# Patient Record
Sex: Female | Born: 1970 | Race: White | Hispanic: No | Marital: Single
Health system: Midwestern US, Academic
[De-identification: ages and names within clinical notes are randomized; demographics above are authoritative.]

---

## 2020-11-02 ENCOUNTER — Encounter: Admit: 2020-11-02 | Discharge: 2020-11-02

## 2020-11-02 NOTE — Telephone Encounter
No personal voice mail.  No message left.

## 2020-11-10 ENCOUNTER — Encounter

## 2020-11-10 DIAGNOSIS — H903 Sensorineural hearing loss, bilateral: Secondary | ICD-10-CM

## 2020-11-10 DIAGNOSIS — H9190 Unspecified hearing loss, unspecified ear: Secondary | ICD-10-CM

## 2020-11-10 DIAGNOSIS — H900 Conductive hearing loss, bilateral: Secondary | ICD-10-CM

## 2020-11-10 DIAGNOSIS — F419 Anxiety disorder, unspecified: Secondary | ICD-10-CM

## 2020-11-10 DIAGNOSIS — E119 Type 2 diabetes mellitus without complications: Secondary | ICD-10-CM

## 2020-11-10 DIAGNOSIS — J302 Other seasonal allergic rhinitis: Secondary | ICD-10-CM

## 2020-11-10 DIAGNOSIS — Q899 Congenital malformation, unspecified: Secondary | ICD-10-CM

## 2020-11-10 DIAGNOSIS — H906 Mixed conductive and sensorineural hearing loss, bilateral: Secondary | ICD-10-CM

## 2020-11-17 ENCOUNTER — Encounter

## 2020-11-17 DIAGNOSIS — H906 Mixed conductive and sensorineural hearing loss, bilateral: Secondary | ICD-10-CM

## 2020-11-30 ENCOUNTER — Encounter: Admit: 2020-11-30 | Discharge: 2020-11-30 | Payer: MEDICARE

## 2020-11-30 NOTE — Telephone Encounter
LVM for Raiford Simmonds, Medical Coordinator, at [employer] regarding need to set up an appt with Dr. Juel Burrow. Name/call back number left.

## 2020-12-01 ENCOUNTER — Encounter: Admit: 2020-12-01 | Discharge: 2020-12-01 | Payer: MEDICARE

## 2020-12-01 NOTE — Telephone Encounter
Raiford Simmonds, medical coordinator, calling to schedule appointment for patient. Cathy agreeable to date/time.

## 2020-12-08 ENCOUNTER — Encounter: Admit: 2020-12-08 | Discharge: 2020-12-08 | Payer: MEDICARE

## 2020-12-08 ENCOUNTER — Ambulatory Visit: Admit: 2020-12-08 | Discharge: 2020-12-08 | Payer: MEDICARE

## 2020-12-08 DIAGNOSIS — H9190 Unspecified hearing loss, unspecified ear: Secondary | ICD-10-CM

## 2020-12-08 DIAGNOSIS — E119 Type 2 diabetes mellitus without complications: Secondary | ICD-10-CM

## 2020-12-08 DIAGNOSIS — H6123 Impacted cerumen, bilateral: Secondary | ICD-10-CM

## 2020-12-08 DIAGNOSIS — J302 Other seasonal allergic rhinitis: Secondary | ICD-10-CM

## 2020-12-08 DIAGNOSIS — F419 Anxiety disorder, unspecified: Secondary | ICD-10-CM

## 2020-12-08 DIAGNOSIS — H7193 Unspecified cholesteatoma, bilateral: Secondary | ICD-10-CM

## 2020-12-08 DIAGNOSIS — Z20822 Encounter for screening laboratory testing for COVID-19 virus in asymptomatic patient: Secondary | ICD-10-CM

## 2020-12-08 DIAGNOSIS — H7123 Cholesteatoma of mastoid, bilateral: Secondary | ICD-10-CM

## 2020-12-08 DIAGNOSIS — Q899 Congenital malformation, unspecified: Secondary | ICD-10-CM

## 2020-12-08 NOTE — Patient Instructions
You may feel dizzy for a few days after surgery. The cut (incision) the doctor made behind your ear may be sore, and you may have ear pain for about a week. Some bloody fluid may drain from your ear canal and the incision. Your ear will probably feel blocked or stuffy. You may not be able to hear as well as before. This usually gets better as the eardrum heals and after the doctor takes the cotton or gauze packing out of the ear canal. The doctor will take out the packing 1 to 2 weeks after surgery. It may take time before your hearing gets better (up to 12 weeks after your surgery). While you are healing, it is important to avoid getting water in your ear. You will also need to avoid heavy lifting, strenuous exercise, and other activities that may put pressure on your eardrum. This includes flying in an airplane, swimming, scuba diving, or playing contact sports.    General Instructions for Surgery Patients    Your Surgery is scheduled for 01/14/2021 with Dr. Paulene Floor      Preparing for Surgery      ? You will get a call from a member of our preregistration team to complete your registration.   If you have any insurance changes prior to your surgery please call [phone] to provide   the updated insurance information.  ? Hold Vitamin E, Vitamin A, Fish oil, multivitamin, and herbal supplements for 14     days prior to surgery.  Hold Ibuprofen (Advil, Motrin), Naproxen (Aleve), and any     other Non-Steroidal Anti-inflammatory medications for 7 days prior to surgery.  If     you take aspirin or any blood thinners, please contact the prescribing physician     for recommendation regarding holding these medications prior to surgery.      Do not stop taking any heart medications or aspirin without seeking the     Approval of your Cardiologist.    ? Refrain from smoking two weeks before and two weeks after surgery.  Nicotine     and tobacco smoke delays healing and can result in scarring. This is the perfect     time to give up the habit.    ? Do not eat or drink anything, including water, after midnight the night before your     surgery.    ? Arrange for someone to take you home from the hospital.      ? Surgery is scheduled at [facility]. The address is [address], [place], [place] [address]. [facility] is on the main hospital campus at the   [employer] [facility] [address].  ? Park in the P5 parking garage off of [address].   ? Enter the [facility] through the 1st floor main entrance.   ? Check in with admitting on 1st floor.   (If you are pre-registered you will go directly to either 2nd floor if going to IR, or 3rd floor to surgery).   ? Check in with surgical reception center (Waiting Room Attendant) after being registered.  ? You will be escorted by staff up to OR.  ? Family will need to expect to wait on 1st level in common areas except during certain points in the visit when they can accompany you.   ? You will be contacted between 2:30 and 4:00 on the last business day before your procedure. If you do not  hear from the Preoperative Assessment Clinic to confirm your arrival time call [phone] before 4:30 p.m.     The Day of Surgery      ? Do not eat or drink anything, including water, after midnight the morning of     surgery.      Essential medication may be taken with a sip of water.    ? Wear loose-fitting clothes that fasten in front or back.  Avoid clothing that pulls     over your head.    ? Leave all valuables at home; do not wear jewelry.    ? Do not wear any facial or eye make-up.  Avoid nail polish.    ? You may wear glasses but do not wear contact lenses.   ? If you wear dentures, keep them in.    ? Bring ID and insurance card with you.     Restrictions --  No strenuous activity or lifting over 10 pounds for two (2) weeks following your Surgery.     If you are concerned about anything you consider significant, call us at [phone]?during business hours, ask for your nurse, Sherron Monday, or the on-call nurse. After hours ask to speak to the ENT doctor on call.      Please call our office if the following should develop after surgery:  a) Cold or sinus infection  b) Foul smelling or excessive drainage from the incision site  c) Temperature greater than 101  d) Excessive swelling or bleeding  e) Persistent nausea or vomiting

## 2020-12-08 NOTE — Progress Notes
Date of Service: 12/08/2020    Subjective:             Kimberly Sullivan is a 50 y.o. female.    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  She lives in a group home, she denies any history of bad ear infections or head trauma.  She did use hearing aids, but they were getting lost frequently.  She otherwise seems relatively healthy.  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     Review of Systems   Constitutional: Negative.    HENT: Positive for hearing loss.    Eyes: Negative.    Respiratory: Negative.    Cardiovascular: Negative.    Gastrointestinal: Negative.    Endocrine: Negative.    Genitourinary: Negative.    Musculoskeletal: Negative.    Skin: Negative.    Allergic/Immunologic: Negative.    Neurological: Negative.    Hematological: Negative.    Psychiatric/Behavioral: Negative.          Objective:         ? fenofibrate nanocrystallized (TRICOR) 145 mg tablet    ? loratadine (CLARITIN) 10 mg tablet    ? metFORMIN (GLUCOPHAGE) 1,000 mg tablet    ? montelukast (SINGULAIR) 10 mg tablet    ? PARoxetine HCL (PAXIL) 30 mg tablet      Vitals:    12/08/20 0835   BP: 116/75   Pulse: 91   Weight: 68 kg (150 lb)   Height: 167.6 cm (5' 6)   PainSc: Zero     Body mass index is 24.21 kg/m?Marland Kitchen     Physical Exam  Under microscopy I removed impacted cerumen from both sides, the right eardrum is retracted in the attic as well as along the posterior mesotympanic space, there is a waxy appearing shiny lesion at the area of the incudostapedial joint behind the eardrum.  On the left side, she has more intense retraction down onto her tympanic facial, the oval window is visible without much in the way of stapes superstructure.  Her audiogram done here previously reveals a right mild to severe mixed hearing loss and a left moderate to severe mixed hearing loss with 96% and 92% discrimination in the ears, respectively.  CT scan was reviewed revealing nodular opacification of the right mastoid as well as opacification of the left mastoid, its moderately pneumatized.       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  She lives in a group home, she denies any history of bad ear infections or head trauma.  She did use hearing aids, but they were getting lost frequently.  She otherwise seems relatively healthy.  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    Under microscopy I removed impacted cerumen from both sides, the right eardrum is retracted in the attic as well as along the posterior mesotympanic space, there is a waxy appearing shiny lesion at the area of the incudostapedial joint behind the eardrum.  On the left side, she has more intense retraction down onto her tympanic facial, the oval window is visible without much in the way of stapes superstructure.  Her audiogram done here previously reveals a right mild to severe mixed hearing loss and a left moderate to severe mixed hearing loss with 96% and 92% discrimination in the ears, respectively.  CT scan was reviewed revealing nodular opacification of the right  mastoid as well as opacification of the left mastoid, its moderately pneumatized.    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  I discussed a staged tympanoplasty with mastoidectomy initially on the left side with risks of hearing loss, facial weakness, dizziness, infection, bleeding, and cerebrospinal fluid leak.  We will get her set up at [facility].

## 2020-12-10 ENCOUNTER — Ambulatory Visit: Admit: 2020-12-10 | Discharge: 2020-12-10 | Payer: MEDICARE

## 2020-12-10 DIAGNOSIS — H7193 Unspecified cholesteatoma, bilateral: Secondary | ICD-10-CM

## 2021-01-05 ENCOUNTER — Encounter: Admit: 2021-01-05 | Discharge: 2021-01-05 | Payer: MEDICARE

## 2021-01-05 DIAGNOSIS — J302 Other seasonal allergic rhinitis: Secondary | ICD-10-CM

## 2021-01-05 DIAGNOSIS — F419 Anxiety disorder, unspecified: Secondary | ICD-10-CM

## 2021-01-05 DIAGNOSIS — E119 Type 2 diabetes mellitus without complications: Secondary | ICD-10-CM

## 2021-01-05 DIAGNOSIS — Q899 Congenital malformation, unspecified: Secondary | ICD-10-CM

## 2021-01-05 DIAGNOSIS — H9190 Unspecified hearing loss, unspecified ear: Secondary | ICD-10-CM

## 2021-01-11 ENCOUNTER — Ambulatory Visit: Admit: 2021-01-11 | Discharge: 2021-01-11 | Payer: MEDICARE

## 2021-01-11 ENCOUNTER — Encounter: Admit: 2021-01-11 | Discharge: 2021-01-11 | Payer: MEDICARE

## 2021-01-11 DIAGNOSIS — Z20822 Encounter for screening laboratory testing for COVID-19 virus in asymptomatic patient: Secondary | ICD-10-CM

## 2021-01-11 LAB — COVID-19 (SARS-COV-2) PCR

## 2021-01-11 NOTE — Progress Notes
Patient arrived to COVID clinic for COVID-19 testing 01/11/21 9;35am. Patient identity confirmed. Nasopharyngeal procedure explained to the patient.   Nasopharyngeal swab completed left  Patient education provided given and instructed patient self isolate until contacted w/ results and further instructions. CDC handout on COVID-19 given to patient.   [email or web address]      Swab collected by [employer] .    Date reason for testing: Pre-Procedure/Surgery

## 2021-01-12 ENCOUNTER — Encounter: Admit: 2021-01-12 | Discharge: 2021-01-12 | Payer: MEDICARE

## 2021-01-12 NOTE — Telephone Encounter
Spoke to patient's caregiver. Caregiver unable to verify patient's date of birth. Caregiver stated she had already seen the patient's COVID-19 test result in MyChart.

## 2021-01-13 ENCOUNTER — Encounter: Admit: 2021-01-13 | Discharge: 2021-01-13 | Payer: MEDICARE

## 2021-01-14 ENCOUNTER — Encounter: Admit: 2021-01-14 | Discharge: 2021-01-14 | Payer: MEDICARE

## 2021-01-14 ENCOUNTER — Ambulatory Visit: Admit: 2021-01-14 | Discharge: 2021-01-14 | Payer: MEDICARE

## 2021-01-14 DIAGNOSIS — Q899 Congenital malformation, unspecified: Secondary | ICD-10-CM

## 2021-01-14 DIAGNOSIS — F419 Anxiety disorder, unspecified: Secondary | ICD-10-CM

## 2021-01-14 DIAGNOSIS — J302 Other seasonal allergic rhinitis: Secondary | ICD-10-CM

## 2021-01-14 DIAGNOSIS — E119 Type 2 diabetes mellitus without complications: Secondary | ICD-10-CM

## 2021-01-14 DIAGNOSIS — H9190 Unspecified hearing loss, unspecified ear: Secondary | ICD-10-CM

## 2021-01-14 MED ORDER — DEXAMETHASONE SODIUM PHOSPHATE 4 MG/ML IJ SOLN
INTRAVENOUS | 0 refills | Status: DC
Start: 2021-01-14 — End: 2021-01-14
  Administered 2021-01-14: 20:00:00 10 mg via INTRAVENOUS

## 2021-01-14 MED ORDER — CEFAZOLIN 1 GRAM IJ SOLR
INTRAVENOUS | 0 refills | Status: DC
Start: 2021-01-14 — End: 2021-01-14
  Administered 2021-01-14: 20:00:00 2 g via INTRAVENOUS

## 2021-01-14 MED ORDER — ELECTROLYTE-A IV SOLP
INTRAVENOUS | 0 refills | Status: DC
Start: 2021-01-14 — End: 2021-01-14
  Administered 2021-01-14: 20:00:00 via INTRAVENOUS

## 2021-01-14 MED ORDER — LIDOCAINE (PF) 200 MG/10 ML (2 %) IJ SYRG
INTRAVENOUS | 0 refills | Status: DC
Start: 2021-01-14 — End: 2021-01-14
  Administered 2021-01-14: 20:00:00 60 mg via INTRAVENOUS

## 2021-01-14 MED ORDER — FENTANYL CITRATE (PF) 50 MCG/ML IJ SOLN
INTRAVENOUS | 0 refills | Status: DC
Start: 2021-01-14 — End: 2021-01-14
  Administered 2021-01-14: 20:00:00 50 ug via INTRAVENOUS

## 2021-01-14 MED ORDER — SUCCINYLCHOLINE CHLORIDE 20 MG/ML IJ SOLN
INTRAVENOUS | 0 refills | Status: DC
Start: 2021-01-14 — End: 2021-01-14
  Administered 2021-01-14: 20:00:00 120 mg via INTRAVENOUS

## 2021-01-14 MED ORDER — ARTIFICIAL TEARS SINGLE DOSE DROPS GROUP
OPHTHALMIC | 0 refills | Status: DC
Start: 2021-01-14 — End: 2021-01-14
  Administered 2021-01-14: 20:00:00 2 [drp] via OPHTHALMIC

## 2021-01-14 MED ORDER — ONDANSETRON HCL (PF) 4 MG/2 ML IJ SOLN
INTRAVENOUS | 0 refills | Status: DC
Start: 2021-01-14 — End: 2021-01-14
  Administered 2021-01-14: 22:00:00 4 mg via INTRAVENOUS

## 2021-01-14 MED ORDER — PROPOFOL INJ 10 MG/ML IV VIAL
INTRAVENOUS | 0 refills | Status: DC
Start: 2021-01-14 — End: 2021-01-14
  Administered 2021-01-14: 20:00:00 150 mg via INTRAVENOUS

## 2021-01-14 MED ORDER — MIDAZOLAM 1 MG/ML IJ SOLN
INTRAVENOUS | 0 refills | Status: DC
Start: 2021-01-14 — End: 2021-01-14
  Administered 2021-01-14: 20:00:00 2 mg via INTRAVENOUS

## 2021-01-14 MED ORDER — DEXMEDETOMIDINE IN 0.9 % NACL 20 MCG/5 ML (4 MCG/ML) IV SYRG
INTRAVENOUS | 0 refills | Status: DC
Start: 2021-01-14 — End: 2021-01-14
  Administered 2021-01-14 (×2): 4 ug via INTRAVENOUS
  Administered 2021-01-14: 22:00:00 12 ug via INTRAVENOUS

## 2021-01-14 MED ORDER — PROPOFOL 10 MG/ML IV EMUL 100 ML (INFUSION)(AM)(OR)
INTRAVENOUS | 0 refills | Status: DC
Start: 2021-01-14 — End: 2021-01-14
  Administered 2021-01-14: 20:00:00 150 ug/kg/min via INTRAVENOUS

## 2021-01-14 MED ORDER — REMIFENTANYL 1000MCG IN NS 20ML (OR)
INTRAVENOUS | 0 refills | Status: DC
Start: 2021-01-14 — End: 2021-01-14
  Administered 2021-01-14 (×2): .05 ug/kg/min via INTRAVENOUS

## 2021-01-14 MED ADMIN — FAMOTIDINE (PF) 20 MG/2 ML IV SOLN [166077]: 20 mg | INTRAVENOUS | @ 20:00:00 | Stop: 2021-01-14 | NDC 70860075141

## 2021-01-14 MED ADMIN — CEFAZOLIN 1 GRAM IJ SOLR [1445]: 3000 mg | INTRAVENOUS | @ 21:00:00 | Stop: 2021-01-14 | NDC 00409080501

## 2021-01-14 MED ADMIN — OFLOXACIN 0.3 % OP DROP [19746]: 2 [drp] | OTIC | @ 21:00:00 | Stop: 2021-01-14 | NDC 64980051505

## 2021-01-14 MED ADMIN — LIDOCAINE-EPINEPHRINE 1 %-1:100,000 IJ SOLN [15955]: 8 mL | INTRAMUSCULAR | @ 21:00:00 | Stop: 2021-01-14 | NDC 00409317817

## 2021-01-14 MED ADMIN — LACTATED RINGERS IV SOLP [4318]: 1000 mL | INTRAVENOUS | @ 19:00:00 | Stop: 2021-01-15 | NDC 00338011704

## 2021-01-14 MED ADMIN — ACETAMINOPHEN 500 MG PO TAB [102]: 1000 mg | ORAL | @ 23:00:00 | Stop: 2021-01-14 | NDC 00904673061

## 2021-01-14 MED ADMIN — CEFAZOLIN 1 GRAM IJ SOLR [1445]: 1000 mg | INTRAVENOUS | @ 21:00:00 | Stop: 2021-01-14 | NDC 00409080501

## 2021-01-14 MED ADMIN — EPINEPHRINE 1 MG/ML IJ SOLN [2850]: 2 mg | @ 21:00:00 | Stop: 2021-01-14 | NDC 42023016801

## 2021-01-14 MED ADMIN — OXYCODONE 5 MG PO TAB [10814]: 5 mg | ORAL | @ 23:00:00 | Stop: 2021-01-14 | NDC 00406055223

## 2021-01-14 MED FILL — OFLOXACIN 0.3 % OT DROP: 0.3 % | 20 days supply | Qty: 10 | Fill #1 | Status: CP

## 2021-01-14 MED FILL — TRAMADOL 50 MG PO TAB: 50 mg | ORAL | 4 days supply | Qty: 12 | Fill #1 | Status: CP

## 2021-01-16 ENCOUNTER — Encounter: Admit: 2021-01-16 | Discharge: 2021-01-16 | Payer: MEDICARE

## 2021-01-16 DIAGNOSIS — Q899 Congenital malformation, unspecified: Secondary | ICD-10-CM

## 2021-01-16 DIAGNOSIS — J302 Other seasonal allergic rhinitis: Secondary | ICD-10-CM

## 2021-01-16 DIAGNOSIS — E119 Type 2 diabetes mellitus without complications: Secondary | ICD-10-CM

## 2021-01-16 DIAGNOSIS — H9190 Unspecified hearing loss, unspecified ear: Secondary | ICD-10-CM

## 2021-01-16 DIAGNOSIS — F419 Anxiety disorder, unspecified: Secondary | ICD-10-CM

## 2021-02-02 ENCOUNTER — Encounter: Admit: 2021-02-02 | Discharge: 2021-02-02 | Payer: MEDICARE

## 2021-02-02 ENCOUNTER — Ambulatory Visit: Admit: 2021-02-02 | Discharge: 2021-02-02 | Payer: MEDICARE

## 2021-02-02 DIAGNOSIS — J302 Other seasonal allergic rhinitis: Secondary | ICD-10-CM

## 2021-02-02 DIAGNOSIS — Q899 Congenital malformation, unspecified: Secondary | ICD-10-CM

## 2021-02-02 DIAGNOSIS — H9190 Unspecified hearing loss, unspecified ear: Secondary | ICD-10-CM

## 2021-02-02 DIAGNOSIS — H7123 Cholesteatoma of mastoid, bilateral: Secondary | ICD-10-CM

## 2021-02-02 DIAGNOSIS — E119 Type 2 diabetes mellitus without complications: Secondary | ICD-10-CM

## 2021-02-02 DIAGNOSIS — F419 Anxiety disorder, unspecified: Secondary | ICD-10-CM

## 2021-02-02 NOTE — Progress Notes
Date of Service: 02/02/2021    Subjective:             Kimberly Sullivan is a 50 y.o. female.    History of Present Illness  Kimberly Sullivan returns after left staged tympanoplasty with mastoidectomy.  She is doing well.     Review of Systems   Constitutional: Negative.    HENT: Negative.    Eyes: Negative.    Respiratory: Negative.    Cardiovascular: Negative.    Gastrointestinal: Negative.    Endocrine: Negative.    Genitourinary: Negative.    Musculoskeletal: Negative.    Skin: Negative.    Allergic/Immunologic: Negative.    Neurological: Negative.    Hematological: Negative.    Psychiatric/Behavioral: Negative.          Objective:         ? calcium carbonate (TUMS PO) Take  by mouth.   ? fenofibrate nanocrystallized (TRICOR) 145 mg tablet    ? fluticasone propionate (FLONASE) 50 mcg/actuation nasal spray, suspension Apply 2 sprays to each nostril as directed daily as needed. Shake bottle gently before using.   ? loratadine (CLARITIN) 10 mg tablet    ? metFORMIN (GLUCOPHAGE) 1,000 mg tablet    ? montelukast (SINGULAIR) 10 mg tablet    ? multivitamin (DAILY VITAMINS PO) Take  by mouth.   ? ofloxacin (FLOXIN) 0.3 % otic solution Beginning 2 weeks after surgery, start up 5 drops twice a day to the left ear.   ? PARoxetine HCL (PAXIL) 30 mg tablet    ? traMADoL (ULTRAM) 50 mg tablet Take one tablet by mouth every 8 hours as needed for Pain.     Vitals:    02/02/21 0907   BP: 118/79   Pulse: 90   PainSc: Zero   Weight: 65.3 kg (144 lb)   Height: 167.6 cm (5' 6)     Body mass index is 23.24 kg/m?Marland Kitchen     Physical Exam    Exam today reveals a well-healing incision behind the ear.  I did remove the deep packing from the left ear canal, there is some granulation tissue present along the vascular strip but otherwise eardrum looks intact.  It is vascularizing well.  I did powder it.     Assessment and Plan:  Kimberly Sullivan returns after left staged tympanoplasty with mastoidectomy.  She is doing well.    Exam today reveals a well-healing incision behind the ear.  I did remove the deep packing from the left ear canal, there is some granulation tissue present along the vascular strip but otherwise eardrum looks intact.  It is vascularizing well.  I did powder it.    Will keep the ear dry still and continue the eardrops, I will have her return in about 3 to 4 weeks.

## 2021-02-14 ENCOUNTER — Encounter: Admit: 2021-02-14 | Discharge: 2021-02-14 | Payer: MEDICARE

## 2021-02-23 ENCOUNTER — Encounter: Admit: 2021-02-23 | Discharge: 2021-02-23 | Payer: MEDICARE

## 2021-02-23 ENCOUNTER — Ambulatory Visit: Admit: 2021-02-23 | Discharge: 2021-02-23 | Payer: MEDICARE

## 2021-02-23 DIAGNOSIS — J302 Other seasonal allergic rhinitis: Secondary | ICD-10-CM

## 2021-02-23 DIAGNOSIS — F419 Anxiety disorder, unspecified: Secondary | ICD-10-CM

## 2021-02-23 DIAGNOSIS — E119 Type 2 diabetes mellitus without complications: Secondary | ICD-10-CM

## 2021-02-23 DIAGNOSIS — Q899 Congenital malformation, unspecified: Secondary | ICD-10-CM

## 2021-02-23 DIAGNOSIS — H9190 Unspecified hearing loss, unspecified ear: Secondary | ICD-10-CM

## 2021-02-23 DIAGNOSIS — H7123 Cholesteatoma of mastoid, bilateral: Secondary | ICD-10-CM

## 2021-02-23 NOTE — Progress Notes
Date of Service: 02/23/2021    Subjective:             Kimberly Sullivan is a 50 y.o. female.    History of Present Illness  Kimberly Sullivan is about 6 weeks out from a left tympanoplasty with mastoidectomy, staged for cholesteatoma.  She is doing well.     Review of Systems   Constitutional: Negative.    HENT: Negative.    Eyes: Negative.    Respiratory: Negative.    Cardiovascular: Negative.    Gastrointestinal: Negative.    Endocrine: Negative.    Genitourinary: Negative.    Musculoskeletal: Negative.    Skin: Negative.    Allergic/Immunologic: Negative.    Neurological: Negative.    Hematological: Negative.    Psychiatric/Behavioral: Negative.          Objective:         ? calcium carbonate (TUMS PO) Take  by mouth.   ? fenofibrate nanocrystallized (TRICOR) 145 mg tablet    ? fluticasone propionate (FLONASE) 50 mcg/actuation nasal spray, suspension Apply 2 sprays to each nostril as directed daily as needed. Shake bottle gently before using.   ? loratadine (CLARITIN) 10 mg tablet    ? metFORMIN (GLUCOPHAGE) 1,000 mg tablet    ? montelukast (SINGULAIR) 10 mg tablet    ? multivitamin (DAILY VITAMINS PO) Take  by mouth.   ? ofloxacin (FLOXIN) 0.3 % otic solution Beginning 2 weeks after surgery, start up 5 drops twice a day to the left ear.   ? PARoxetine HCL (PAXIL) 30 mg tablet    ? traMADoL (ULTRAM) 50 mg tablet Take one tablet by mouth every 8 hours as needed for Pain.     Vitals:    02/23/21 0909   BP: 124/76   Pulse: 98   PainSc: Zero   Weight: 65.3 kg (144 lb)   Height: 167.6 cm (5' 6)     Body mass index is 23.24 kg/m?Marland Kitchen     Physical Exam    The incision is healing well, aside from a small ball of granulation superiorly along the vascular strip incision laterally that I removed, the eardrum looks like it sealed and dry.  I did powder it.       Assessment and Plan:  Kimberly Sullivan is about 6 weeks out from a left tympanoplasty with mastoidectomy, staged for cholesteatoma.  She is doing well.    The incision is healing well, aside from a small ball of granulation superiorly along the vascular strip incision laterally that I removed, the eardrum looks like it sealed and dry.  I did powder it.    She can stop the drops, in a week it can get wet.  I will have her return in about 6 to 8 weeks with repeat hearing test and planning for the second look.

## 2021-04-20 ENCOUNTER — Ambulatory Visit: Admit: 2021-04-20 | Discharge: 2021-04-20 | Payer: MEDICARE

## 2021-04-20 ENCOUNTER — Encounter: Admit: 2021-04-20 | Discharge: 2021-04-20 | Payer: MEDICARE

## 2021-04-20 DIAGNOSIS — H7192 Unspecified cholesteatoma, left ear: Secondary | ICD-10-CM

## 2021-04-20 DIAGNOSIS — J302 Other seasonal allergic rhinitis: Secondary | ICD-10-CM

## 2021-04-20 DIAGNOSIS — H9190 Unspecified hearing loss, unspecified ear: Secondary | ICD-10-CM

## 2021-04-20 DIAGNOSIS — F419 Anxiety disorder, unspecified: Secondary | ICD-10-CM

## 2021-04-20 DIAGNOSIS — H93293 Other abnormal auditory perceptions, bilateral: Secondary | ICD-10-CM

## 2021-04-20 DIAGNOSIS — Q899 Congenital malformation, unspecified: Secondary | ICD-10-CM

## 2021-04-20 DIAGNOSIS — E119 Type 2 diabetes mellitus without complications: Secondary | ICD-10-CM

## 2021-04-20 DIAGNOSIS — H6122 Impacted cerumen, left ear: Secondary | ICD-10-CM

## 2021-04-20 DIAGNOSIS — H7123 Cholesteatoma of mastoid, bilateral: Secondary | ICD-10-CM

## 2021-04-20 NOTE — Patient Instructions
You may feel dizzy for a few days after surgery. The cut (incision) the doctor made behind your ear may be sore, and you may have ear pain for about a week. Some bloody fluid may drain from your ear canal and the incision. Your ear will probably feel blocked or stuffy. You may not be able to hear as well as before. This usually gets better as the eardrum heals and after the doctor takes the cotton or gauze packing out of the ear canal. The doctor will take out the packing 1 to 2 weeks after surgery. It may take time before your hearing gets better (up to 12 weeks after your surgery). While you are healing, it is important to avoid getting water in your ear. You will also need to avoid heavy lifting, strenuous exercise, and other activities that may put pressure on your eardrum. This includes swimming, scuba diving, or playing contact sports.    General Instructions for Surgery Patients    Your Surgery is scheduled for 06/24/2021 with Dr. Paulene Floor      Preparing for Surgery      ? You will get a call from a member of our preregistration team to complete your registration.   If you have any insurance changes prior to your surgery please call [phone] to provide   the updated insurance information.  ? Hold Vitamin E, Vitamin A, Fish oil, multivitamin, and herbal supplements for 14     days prior to surgery.  Hold Ibuprofen (Advil, Motrin), Naproxen (Aleve), and any     other Non-Steroidal Anti-inflammatory medications for 7 days prior to surgery.  If     you take aspirin or any blood thinners, please contact the prescribing physician     for recommendation regarding holding these medications prior to surgery.      Do not stop taking any heart medications or aspirin without seeking the     Approval of your Cardiologist.    ? Refrain from smoking two weeks before and two weeks after surgery.  Nicotine     and tobacco smoke delays healing and can result in scarring. This is the perfect     time to give up the habit. ? Do not eat or drink anything, including water, after midnight the night before your     surgery.    ? Arrange for someone to take you home from the hospital.      ? Surgery is scheduled at [facility]. The address is [address], [place], [place] [address]. [facility] is on the main hospital campus at the   [employer] [address] [employer].  ? Park in the P5 parking garage off of [address].   ? Enter the [facility] through the 1st floor main entrance.   ? Check in with admitting on 1st floor.   (If you are pre-registered you will go directly to either 2nd floor if going to IR, or 3rd floor to surgery).   ? Check in with surgical reception center (Waiting Room Attendant) after being registered.  ? You will be escorted by staff up to OR.  ? Family will need to expect to wait on 1st level in common areas except during certain points in the visit when they can accompany you.   ? You will be contacted between 2:30 and 4:00 on the last business day before your procedure. If you do not hear from the Preoperative Assessment Clinic to  confirm your arrival time call [phone] before 4:30 p.m.     The Day of Surgery      ? Do not eat or drink anything, including water, after midnight the morning of     surgery.      Essential medication may be taken with a sip of water.    ? Wear loose-fitting clothes that fasten in front or back.  Avoid clothing that pulls     over your head.    ? Leave all valuables at home; do not wear jewelry.    ? Do not wear any facial or eye make-up.  Avoid nail polish.    ? You may wear glasses but do not wear contact lenses.   ? If you wear dentures, keep them in.    ? Bring ID and insurance card with you.     Restrictions --  No strenuous activity or lifting over 10 pounds for two (2) weeks following your Surgery.     If you are concerned about anything you consider significant, call us at [phone]--during business hours, ask for your nurse, Sherron Monday, or the on-call nurse. After hours ask to speak to the ENT doctor on call.      Please call our office if the following should develop after surgery:  a) Cold or sinus infection  b) Foul smelling or excessive drainage from the incision site  c) Temperature greater than 101  d) Excessive swelling or bleeding  e) Persistent nausea or vomiting

## 2021-04-20 NOTE — Progress Notes
Date of Service: 04/20/2021    Subjective:             Zoraya Fiorenza is a 50 y.o. female.    History of Present Illness  Julicia returns after left staged tympanoplasty with mastoidectomy for cholesteatoma.  She is doing okay.     Review of Systems   Constitutional: Negative.    HENT: Negative.    Eyes: Negative.    Respiratory: Negative.    Cardiovascular: Negative.    Gastrointestinal: Negative.    Endocrine: Negative.    Genitourinary: Negative.    Musculoskeletal: Negative.    Skin: Negative.    Allergic/Immunologic: Negative.    Neurological: Negative.    Hematological: Negative.    Psychiatric/Behavioral: Negative.          Objective:         ? calcium carbonate (TUMS PO) Take  by mouth.   ? fenofibrate nanocrystallized (TRICOR) 145 mg tablet    ? fluticasone propionate (FLONASE) 50 mcg/actuation nasal spray, suspension Apply 2 sprays to each nostril as directed daily as needed. Shake bottle gently before using.   ? loratadine (CLARITIN) 10 mg tablet    ? metFORMIN (GLUCOPHAGE) 1,000 mg tablet    ? montelukast (SINGULAIR) 10 mg tablet    ? multivitamin (DAILY VITAMINS PO) Take  by mouth.   ? ofloxacin (FLOXIN) 0.3 % otic solution Beginning 2 weeks after surgery, start up 5 drops twice a day to the left ear.   ? PARoxetine HCL (PAXIL) 30 mg tablet    ? traMADoL (ULTRAM) 50 mg tablet Take one tablet by mouth every 8 hours as needed for Pain.     Vitals:    04/20/21 0939   BP: 118/82   Pulse: 90   PainSc: Zero   Weight: 65.3 kg (144 lb)   Height: 167.6 cm (5' 6)     Body mass index is 23.24 kg/m?Marland Kitchen     Physical Exam    Exam today reveals left cerumen impaction I removed with a curette, eardrum is intact and stiffly mobile to insufflation.  Her audiogram today reveals a right moderate to severe mixed hearing loss in the left moderate to severe mixed hearing loss, worse in the left than the right at the mid frequencies.  She has 96% and 92% discrimination in the right and left ears, respectively.       Assessment and Plan:  Deaja returns after left staged tympanoplasty with mastoidectomy for cholesteatoma.  She is doing okay.    Exam today reveals left cerumen impaction I removed with a curette, eardrum is intact and stiffly mobile to insufflation.  Her audiogram today reveals a right moderate to severe mixed hearing loss in the left moderate to severe mixed hearing loss, worse in the left than the right at the mid frequencies.  She has 96% and 92% discrimination in the right and left ears, respectively.    I discussed the left second look tympanoplasty with mastoidectomy and ossicular chain reconstruction.  Risks hearing loss, facial weakness, dizziness, infection, bleeding, and cerebrospinal fluid leak were discussed.

## 2021-06-07 ENCOUNTER — Encounter: Admit: 2021-06-07 | Discharge: 2021-06-07 | Payer: MEDICARE

## 2021-06-07 DIAGNOSIS — H9190 Unspecified hearing loss, unspecified ear: Secondary | ICD-10-CM

## 2021-06-07 DIAGNOSIS — F419 Anxiety disorder, unspecified: Secondary | ICD-10-CM

## 2021-06-07 DIAGNOSIS — J302 Other seasonal allergic rhinitis: Secondary | ICD-10-CM

## 2021-06-07 DIAGNOSIS — E119 Type 2 diabetes mellitus without complications: Secondary | ICD-10-CM

## 2021-06-07 DIAGNOSIS — Q899 Congenital malformation, unspecified: Secondary | ICD-10-CM

## 2021-06-23 ENCOUNTER — Encounter: Admit: 2021-06-23 | Discharge: 2021-06-23 | Payer: MEDICARE

## 2021-06-24 ENCOUNTER — Encounter: Admit: 2021-06-24 | Discharge: 2021-06-24 | Payer: MEDICARE

## 2021-06-24 ENCOUNTER — Ambulatory Visit: Admit: 2021-06-24 | Discharge: 2021-06-24 | Payer: MEDICARE

## 2021-06-24 DIAGNOSIS — H9190 Unspecified hearing loss, unspecified ear: Secondary | ICD-10-CM

## 2021-06-24 DIAGNOSIS — J302 Other seasonal allergic rhinitis: Secondary | ICD-10-CM

## 2021-06-24 DIAGNOSIS — E119 Type 2 diabetes mellitus without complications: Secondary | ICD-10-CM

## 2021-06-24 DIAGNOSIS — F419 Anxiety disorder, unspecified: Secondary | ICD-10-CM

## 2021-06-24 DIAGNOSIS — Q899 Congenital malformation, unspecified: Secondary | ICD-10-CM

## 2021-06-24 MED ORDER — ARTIFICIAL TEARS (PF) SINGLE DOSE DROPS GROUP
OPHTHALMIC | 0 refills | Status: DC
Start: 2021-06-24 — End: 2021-06-24
  Administered 2021-06-24: 18:00:00 1 [drp] via OPHTHALMIC

## 2021-06-24 MED ORDER — EPHEDRINE SULFATE 50 MG/ML IV SOLN
INTRAVENOUS | 0 refills | Status: DC
Start: 2021-06-24 — End: 2021-06-24
  Administered 2021-06-24: 18:00:00 10 mg via INTRAVENOUS

## 2021-06-24 MED ORDER — MIDAZOLAM 1 MG/ML IJ SOLN
INTRAVENOUS | 0 refills | Status: DC
Start: 2021-06-24 — End: 2021-06-24
  Administered 2021-06-24: 17:00:00 2 mg via INTRAVENOUS

## 2021-06-24 MED ORDER — PHENYLEPHRINE HCL IN 0.9% NACL 1 MG/10 ML (100 MCG/ML) IV SYRG
INTRAVENOUS | 0 refills | Status: DC
Start: 2021-06-24 — End: 2021-06-24
  Administered 2021-06-24: 19:00:00 50 ug via INTRAVENOUS
  Administered 2021-06-24: 18:00:00 100 ug via INTRAVENOUS
  Administered 2021-06-24 (×3): 50 ug via INTRAVENOUS
  Administered 2021-06-24: 18:00:00 100 ug via INTRAVENOUS
  Administered 2021-06-24: 19:00:00 50 ug via INTRAVENOUS

## 2021-06-24 MED ORDER — PROPOFOL 10 MG/ML IV EMUL 100 ML (INFUSION)(AM)(OR)
INTRAVENOUS | 0 refills | Status: DC
Start: 2021-06-24 — End: 2021-06-24
  Administered 2021-06-24: 18:00:00 130 ug/kg/min via INTRAVENOUS

## 2021-06-24 MED ORDER — PROPOFOL INJ 10 MG/ML IV VIAL
INTRAVENOUS | 0 refills | Status: DC
Start: 2021-06-24 — End: 2021-06-24
  Administered 2021-06-24: 18:00:00 100 mg via INTRAVENOUS

## 2021-06-24 MED ORDER — SUCCINYLCHOLINE CHLORIDE 20 MG/ML IJ SOLN
INTRAVENOUS | 0 refills | Status: DC
Start: 2021-06-24 — End: 2021-06-24
  Administered 2021-06-24: 18:00:00 100 mg via INTRAVENOUS

## 2021-06-24 MED ORDER — LIDOCAINE (PF) 200 MG/10 ML (2 %) IJ SYRG
INTRAVENOUS | 0 refills | Status: DC
Start: 2021-06-24 — End: 2021-06-24
  Administered 2021-06-24: 18:00:00 60 mg via INTRAVENOUS

## 2021-06-24 MED ORDER — REMIFENTANYL 1000MCG IN NS 20ML (OR)
INTRAVENOUS | 0 refills | Status: DC
Start: 2021-06-24 — End: 2021-06-24
  Administered 2021-06-24 (×2): .08 ug/kg/min via INTRAVENOUS

## 2021-06-24 MED ORDER — FENTANYL CITRATE (PF) 50 MCG/ML IJ SOLN
INTRAVENOUS | 0 refills | Status: DC
Start: 2021-06-24 — End: 2021-06-24
  Administered 2021-06-24: 17:00:00 50 ug via INTRAVENOUS

## 2021-06-24 MED ORDER — ONDANSETRON HCL (PF) 4 MG/2 ML IJ SOLN
INTRAVENOUS | 0 refills | Status: DC
Start: 2021-06-24 — End: 2021-06-24
  Administered 2021-06-24: 19:00:00 4 mg via INTRAVENOUS

## 2021-06-24 MED ORDER — CEFAZOLIN 1 GRAM IJ SOLR
INTRAVENOUS | 0 refills | Status: DC
Start: 2021-06-24 — End: 2021-06-24
  Administered 2021-06-24: 18:00:00 2 g via INTRAVENOUS

## 2021-06-24 MED ORDER — DEXMEDETOMIDINE IN 0.9 % NACL 20 MCG/5 ML (4 MCG/ML) IV SYRG
INTRAVENOUS | 0 refills | Status: DC
Start: 2021-06-24 — End: 2021-06-24
  Administered 2021-06-24: 18:00:00 4 ug via INTRAVENOUS
  Administered 2021-06-24: 17:00:00 8 ug via INTRAVENOUS

## 2021-06-24 MED ORDER — DEXAMETHASONE SODIUM PHOSPHATE 4 MG/ML IJ SOLN
INTRAVENOUS | 0 refills | Status: DC
Start: 2021-06-24 — End: 2021-06-24
  Administered 2021-06-24: 18:00:00 4 mg via INTRAVENOUS

## 2021-06-24 MED ADMIN — CEFAZOLIN 1 GRAM IJ SOLR [1445]: 1000 mL | @ 18:00:00 | Stop: 2021-06-24 | NDC 00143992490

## 2021-06-24 MED ADMIN — EPINEPHRINE 1 MG/ML IJ SOLN [2850]: 2 mL | @ 18:00:00 | Stop: 2021-06-24 | NDC 42023016801

## 2021-06-24 MED ADMIN — SODIUM CHLORIDE 0.9 % IR SOLN [11403]: 1000 mL | @ 18:00:00 | Stop: 2021-06-24 | NDC 00338004804

## 2021-06-24 MED ADMIN — SODIUM CHLORIDE 0.9% IRRIGATION BAG [210330]: 3000 mL | @ 18:00:00 | Stop: 2021-06-24 | NDC 00338004747

## 2021-06-24 MED ADMIN — CEFAZOLIN 1 GRAM IJ SOLR [1445]: 3000 mL | @ 18:00:00 | Stop: 2021-06-24 | NDC 00143992490

## 2021-06-24 MED ADMIN — LIDOCAINE-EPINEPHRINE 1 %-1:100,000 IJ SOLN [15955]: 3 mL | INTRAMUSCULAR | @ 18:00:00 | Stop: 2021-06-24 | NDC 00409317817

## 2021-06-24 MED ADMIN — OFLOXACIN 0.3 % OP DROP [19746]: 8 [drp] | OTIC | @ 18:00:00 | Stop: 2021-06-24 | NDC 64980051505

## 2021-06-24 MED ADMIN — MUPIROCIN 2 % TP OINT [10674]: 1 | TOPICAL | @ 18:00:00 | Stop: 2021-06-24 | NDC 45802011222

## 2021-06-24 MED ADMIN — LACTATED RINGERS IV SOLP [4318]: 1000 mL | INTRAVENOUS | @ 16:00:00 | Stop: 2021-06-24 | NDC 00338011704

## 2021-06-24 MED FILL — TRAMADOL 50 MG PO TAB: 50 mg | ORAL | 3 days supply | Qty: 8 | Fill #1 | Status: CP

## 2021-06-24 MED FILL — OFLOXACIN 0.3 % OT DROP: 0.3 % | 30 days supply | Qty: 10 | Fill #1 | Status: CP

## 2021-06-26 ENCOUNTER — Encounter: Admit: 2021-06-26 | Discharge: 2021-06-26 | Payer: MEDICARE

## 2021-06-26 DIAGNOSIS — E119 Type 2 diabetes mellitus without complications: Secondary | ICD-10-CM

## 2021-06-26 DIAGNOSIS — Q899 Congenital malformation, unspecified: Secondary | ICD-10-CM

## 2021-06-26 DIAGNOSIS — F419 Anxiety disorder, unspecified: Secondary | ICD-10-CM

## 2021-06-26 DIAGNOSIS — J302 Other seasonal allergic rhinitis: Secondary | ICD-10-CM

## 2021-06-26 DIAGNOSIS — H9190 Unspecified hearing loss, unspecified ear: Secondary | ICD-10-CM

## 2021-07-13 ENCOUNTER — Encounter: Admit: 2021-07-13 | Discharge: 2021-07-13 | Payer: MEDICARE

## 2021-07-13 ENCOUNTER — Ambulatory Visit: Admit: 2021-07-13 | Discharge: 2021-07-13 | Payer: MEDICARE

## 2021-07-13 DIAGNOSIS — E119 Type 2 diabetes mellitus without complications: Secondary | ICD-10-CM

## 2021-07-13 DIAGNOSIS — H9190 Unspecified hearing loss, unspecified ear: Secondary | ICD-10-CM

## 2021-07-13 DIAGNOSIS — F419 Anxiety disorder, unspecified: Secondary | ICD-10-CM

## 2021-07-13 DIAGNOSIS — J302 Other seasonal allergic rhinitis: Secondary | ICD-10-CM

## 2021-07-13 DIAGNOSIS — Q899 Congenital malformation, unspecified: Secondary | ICD-10-CM

## 2021-07-13 DIAGNOSIS — H7123 Cholesteatoma of mastoid, bilateral: Secondary | ICD-10-CM

## 2021-07-13 NOTE — Progress Notes
Date of Service: 07/13/2021    Subjective:             Kimberly Sullivan is a 50 y.o. female.    History of Present Illness  Kimberly Sullivan returns after left second look tympanoplasty with mastoidectomy and ossicular chain reconstruction using partial ossicular prosthetic.  Doing well.     Review of Systems   Constitutional: Negative.    HENT: Negative.    Eyes: Negative.    Respiratory: Negative.    Cardiovascular: Negative.    Gastrointestinal: Negative.    Endocrine: Negative.    Genitourinary: Negative.    Musculoskeletal: Negative.    Skin: Negative.    Allergic/Immunologic: Negative.    Neurological: Negative.    Hematological: Negative.    Psychiatric/Behavioral: Negative.          Objective:         ? calcium carbonate (TUMS PO) Take  by mouth.   ? fenofibrate nanocrystallized (TRICOR) 145 mg tablet    ? fluticasone propionate (FLONASE) 50 mcg/actuation nasal spray, suspension Apply 2 sprays to each nostril as directed daily as needed. Shake bottle gently before using.   ? loratadine (CLARITIN) 10 mg tablet    ? metFORMIN (GLUCOPHAGE) 1,000 mg tablet    ? montelukast (SINGULAIR) 10 mg tablet    ? multivitamin (DAILY VITAMINS PO) Take  by mouth.   ? ofloxacin (FLOXIN) 0.3 % otic solution Beginning 2 weeks after surgery, start up 5 drops twice a day to the left ear.   ? PARoxetine HCL (PAXIL) 30 mg tablet    ? traMADoL (ULTRAM) 50 mg tablet Take one tablet by mouth every 8 hours as needed.     Vitals:    07/13/21 0800   BP: 107/74   Pulse: 85   PainSc: Zero   Weight: 65.8 kg (145 lb)   Height: 162.6 cm (5' 4)     Body mass index is 24.89 kg/m?Marland Kitchen     Physical Exam  Examined microscopy reveals a well-healing incision and the packing was removed from the left ear canal, the eardrum looks intact with some granulation along the vascular strip region I powdered.  It is mild.         Assessment and Plan:  Kimberly Sullivan returns after left second look tympanoplasty with mastoidectomy and ossicular chain reconstruction using partial ossicular prosthetic.  Doing well.    Examined microscopy reveals a well-healing incision and the packing was removed from the left ear canal, the eardrum looks intact with some granulation along the vascular strip region I powdered.  It is mild.    She will continue drops, no other restrictions except for dry ear precautions.  I will have her return in about 4 to 5 weeks.

## 2021-08-17 ENCOUNTER — Encounter: Admit: 2021-08-17 | Discharge: 2021-08-17 | Payer: MEDICARE

## 2021-08-17 ENCOUNTER — Ambulatory Visit: Admit: 2021-08-17 | Discharge: 2021-08-17 | Payer: MEDICARE

## 2021-08-17 DIAGNOSIS — J302 Other seasonal allergic rhinitis: Secondary | ICD-10-CM

## 2021-08-17 DIAGNOSIS — E119 Type 2 diabetes mellitus without complications: Secondary | ICD-10-CM

## 2021-08-17 DIAGNOSIS — F419 Anxiety disorder, unspecified: Secondary | ICD-10-CM

## 2021-08-17 DIAGNOSIS — H9190 Unspecified hearing loss, unspecified ear: Secondary | ICD-10-CM

## 2021-08-17 DIAGNOSIS — H7123 Cholesteatoma of mastoid, bilateral: Secondary | ICD-10-CM

## 2021-08-17 DIAGNOSIS — Q899 Congenital malformation, unspecified: Secondary | ICD-10-CM

## 2021-08-17 NOTE — Progress Notes
Date of Service: 08/17/2021    Subjective:             Kimberly Sullivan is a 50 y.o. female.    History of Present Illness  Kimberly Sullivan is almost 2 months out from a left second look tympanoplasty with mastoidectomy and ossicular chain reconstruction.  She is doing well.  She states her left ear is her better hearing ear.     Review of Systems   Constitutional: Negative.    HENT: Negative.    Eyes: Negative.    Respiratory: Negative.    Cardiovascular: Negative.    Gastrointestinal: Negative.    Endocrine: Negative.    Genitourinary: Negative.    Musculoskeletal: Negative.    Skin: Negative.    Allergic/Immunologic: Negative.    Neurological: Negative.    Hematological: Negative.    Psychiatric/Behavioral: Negative.          Objective:         ? calcium carbonate (TUMS PO) Take  by mouth.   ? fenofibrate nanocrystallized (TRICOR) 145 mg tablet    ? fluticasone propionate (FLONASE) 50 mcg/actuation nasal spray, suspension Apply 2 sprays to each nostril as directed daily as needed. Shake bottle gently before using.   ? loratadine (CLARITIN) 10 mg tablet    ? metFORMIN (GLUCOPHAGE) 1,000 mg tablet    ? montelukast (SINGULAIR) 10 mg tablet    ? multivitamin (DAILY VITAMINS PO) Take  by mouth.   ? ofloxacin (FLOXIN) 0.3 % otic solution Beginning 2 weeks after surgery, start up 5 drops twice a day to the left ear.   ? PARoxetine HCL (PAXIL) 30 mg tablet    ? traMADoL (ULTRAM) 50 mg tablet Take one tablet by mouth every 8 hours as needed.     Vitals:    08/17/21 0821   BP: 116/76   Pulse: 102   PainSc: Zero   Weight: 65.8 kg (145 lb)   Height: 162.6 cm (5' 4)     Body mass index is 24.89 kg/m?Marland Kitchen     Physical Exam  Exam today under microscopy reveals a sealed and dry left eardrum, I did powder it.  The right side reveals an attic retraction pocket with keratin cyst in the posterior superior quadrant in the middle ear space.       Assessment and Plan:  Kimberly Sullivan is almost 2 months out from a left second look tympanoplasty with mastoidectomy and ossicular chain reconstruction.  She is doing well.  She states her left ear is her better hearing ear.    Exam today under microscopy reveals a sealed and dry left eardrum, I did powder it.  The right side reveals an attic retraction pocket with keratin cyst in the posterior superior quadrant in the middle ear space.    She can stop the eardrops, in about a week she can get water in her ear on the left side.  I will have her return in 4 to 6 weeks with a repeat hearing test.

## 2021-11-09 ENCOUNTER — Encounter: Admit: 2021-11-09 | Discharge: 2021-11-09 | Payer: MEDICARE

## 2021-11-09 ENCOUNTER — Ambulatory Visit: Admit: 2021-11-09 | Discharge: 2021-11-09 | Payer: MEDICARE

## 2021-11-09 DIAGNOSIS — F419 Anxiety disorder, unspecified: Secondary | ICD-10-CM

## 2021-11-09 DIAGNOSIS — H7193 Unspecified cholesteatoma, bilateral: Secondary | ICD-10-CM

## 2021-11-09 DIAGNOSIS — Q899 Congenital malformation, unspecified: Secondary | ICD-10-CM

## 2021-11-09 DIAGNOSIS — E119 Type 2 diabetes mellitus without complications: Secondary | ICD-10-CM

## 2021-11-09 DIAGNOSIS — H6123 Impacted cerumen, bilateral: Secondary | ICD-10-CM

## 2021-11-09 DIAGNOSIS — J302 Other seasonal allergic rhinitis: Secondary | ICD-10-CM

## 2021-11-09 DIAGNOSIS — H906 Mixed conductive and sensorineural hearing loss, bilateral: Secondary | ICD-10-CM

## 2021-11-09 DIAGNOSIS — H9190 Unspecified hearing loss, unspecified ear: Secondary | ICD-10-CM

## 2021-11-09 NOTE — Patient Instructions
You may feel dizzy for a few days after surgery. The cut (incision) the doctor made behind your ear may be sore, and you may have ear pain for about a week. Some bloody fluid may drain from your ear canal and the incision. Your ear will probably feel blocked or stuffy. You may not be able to hear as well as before. This usually gets better as the eardrum heals and after the doctor takes the cotton or gauze packing out of the ear canal. The doctor will take out the packing 1 to 2 weeks after surgery. It may take time before your hearing gets better (up to 12 weeks after your surgery). While you are healing, it is important to avoid getting water in your ear. You will also need to avoid heavy lifting, strenuous exercise, and other activities that may put pressure on your eardrum. This includes swimming, scuba diving, or playing contact sports.    General Instructions for Surgery Patients    Your Surgery is scheduled for 03/17/2022 with Dr. Paulene Floor      Preparing for Surgery      ? You will get a call from a member of our preregistration team to complete your registration.   If you have any insurance changes prior to your surgery please call [phone] to provide   the updated insurance information.  ? Hold Vitamin E, Vitamin A, Fish oil, multivitamin, and herbal supplements for 14     days prior to surgery.  Hold Ibuprofen (Advil, Motrin), Naproxen (Aleve), and any     other Non-Steroidal Anti-inflammatory medications for 7 days prior to surgery.  If     you take aspirin or any blood thinners, please contact the prescribing physician     for recommendation regarding holding these medications prior to surgery.      Do not stop taking any heart medications or aspirin without seeking the     Approval of your Cardiologist.    ? Refrain from smoking two weeks before and two weeks after surgery.  Nicotine     and tobacco smoke delays healing and can result in scarring. This is the perfect     time to give up the habit. ? Do not eat or drink anything, including water, after midnight the night before your     surgery.    ? Arrange for someone to take you home from the hospital.      ? Surgery is scheduled at [facility]. The address is [address] [place], [place] [address]. [facility] is on the main hospital campus at the   [employer] [facility] [address].  Park in the P5 parking garage off of [address].   Psychologist, sport and exercise the [facility] through the 1st floor main entrance.   Check in with admitting on 1st floor.   (If you are pre-registered you will go directly to either 2nd floor if going to IR, or 3rd floor to surgery).   Check in with surgical reception center (Waiting Room Attendant) after being registered.  You will be escorted by staff up to OR.  Family will need to expect to wait on 1st level in common areas except during certain points in the visit when they can accompany you.   You will be contacted between 2:30 and 4:00 on the last business day before your procedure. If you do not hear from the [facility]operative [facility] to confirm your arrival time call [phone] before  4:30 p.m.     The Day of Surgery      ? Do not eat or drink anything, including water, after midnight the morning of     surgery.      Essential medication may be taken with a sip of water.    ? Wear loose-fitting clothes that fasten in front or back.  Avoid clothing that pulls     over your head.    ? Leave all valuables at home; do not wear jewelry.    ? Do not wear any facial or eye make-up.  Avoid nail polish.    ? You may wear glasses but do not wear contact lenses.   ? If you wear dentures, keep them in.    ? Bring ID and insurance card with you.     Restrictions --  No strenuous activity or lifting over 10 pounds for two (2) weeks following your Surgery.     If you are concerned about anything you consider significant, call us at [phone]--during business hours, ask for your nurse, Sherron Monday, or the on-call nurse. After hours ask to speak to the ENT doctor on call.      Please call our office if the following should develop after surgery:  a) Cold or sinus infection  b) Foul smelling or excessive drainage from the incision site  c) Temperature greater than 101  d) Excessive swelling or bleeding  e) Persistent nausea or vomiting

## 2021-11-09 NOTE — Progress Notes
Date of Service: 11/09/2021    Subjective:             Kimberly Sullivan is a 51 y.o. female.    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  She is doing okay.     Review of Systems   Constitutional: Negative.    HENT: Negative.    Eyes: Negative.    Respiratory: Negative.    Cardiovascular: Negative.    Gastrointestinal: Negative.    Endocrine: Negative.    Genitourinary: Negative.    Musculoskeletal: Negative.    Skin: Negative.    Allergic/Immunologic: Negative.    Neurological: Negative.    Hematological: Negative.    Psychiatric/Behavioral: Negative.          Objective:         ? calcium carbonate (TUMS PO) Take  by mouth.   ? fenofibrate nanocrystallized (TRICOR) 145 mg tablet    ? fluticasone propionate (FLONASE) 50 mcg/actuation nasal spray, suspension Apply 2 sprays to each nostril as directed daily as needed. Shake bottle gently before using.   ? glimepiride (AMARYL) 4 mg tablet    ? loratadine (CLARITIN) 10 mg tablet    ? metFORMIN (GLUCOPHAGE) 1,000 mg tablet    ? montelukast (SINGULAIR) 10 mg tablet    ? multivitamin (DAILY VITAMINS PO) Take  by mouth.   ? ofloxacin (FLOXIN) 0.3 % otic solution Beginning 2 weeks after surgery, start up 5 drops twice a day to the left ear.   ? PARoxetine HCL (PAXIL) 30 mg tablet    ? traMADoL (ULTRAM) 50 mg tablet Take one tablet by mouth every 8 hours as needed.     Vitals:    11/09/21 1406   BP: 121/82   Pulse: 98   PainSc: Zero   Weight: 65.8 kg (145 lb)   Height: 162.6 cm (5' 4)     Body mass index is 24.89 kg/m?Marland Kitchen     Physical Exam    Exam today under microscopy reveals cerumen impaction on both sides I removed with a curette, the left side has a shallow attic retraction and cartilage grafting.  Eardrum is sealed and intact I did unroofed a epithelial inclusion cyst anterior to the malleus.  On the right side, there is a large cholesteatoma posteriorly extending into the attic region behind the eardrum to the posterior superior quadrant of the mesotympanum.  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  She is doing okay.    Exam today under microscopy reveals cerumen impaction on both sides I removed with a curette, the left side has a shallow attic retraction and cartilage grafting.  Eardrum is sealed and intact I did unroofed a epithelial inclusion cyst anterior to the malleus.  On the right side, there is a large cholesteatoma posteriorly extending into the attic region behind the eardrum to the posterior superior quadrant of the mesotympanum.  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  I discussed a right tympanoplasty with mastoidectomy, risks hearing loss, facial weakness, dizziness, infection, bleeding, and cerebrospinal fluid leak discussed.

## 2022-02-10 ENCOUNTER — Encounter: Admit: 2022-02-10 | Discharge: 2022-02-10 | Payer: MEDICARE

## 2022-02-10 DIAGNOSIS — E119 Type 2 diabetes mellitus without complications: Secondary | ICD-10-CM

## 2022-02-10 DIAGNOSIS — F419 Anxiety disorder, unspecified: Secondary | ICD-10-CM

## 2022-02-10 DIAGNOSIS — J302 Other seasonal allergic rhinitis: Secondary | ICD-10-CM

## 2022-02-10 DIAGNOSIS — H9190 Unspecified hearing loss, unspecified ear: Secondary | ICD-10-CM

## 2022-02-10 DIAGNOSIS — Q899 Congenital malformation, unspecified: Secondary | ICD-10-CM

## 2022-03-17 ENCOUNTER — Encounter: Admit: 2022-03-17 | Discharge: 2022-03-17 | Payer: MEDICARE

## 2022-03-17 ENCOUNTER — Ambulatory Visit: Admit: 2022-03-17 | Discharge: 2022-03-17 | Payer: MEDICARE

## 2022-03-17 DIAGNOSIS — J302 Other seasonal allergic rhinitis: Secondary | ICD-10-CM

## 2022-03-17 DIAGNOSIS — Q899 Congenital malformation, unspecified: Secondary | ICD-10-CM

## 2022-03-17 DIAGNOSIS — F419 Anxiety disorder, unspecified: Secondary | ICD-10-CM

## 2022-03-17 DIAGNOSIS — E119 Type 2 diabetes mellitus without complications: Secondary | ICD-10-CM

## 2022-03-17 DIAGNOSIS — H9190 Unspecified hearing loss, unspecified ear: Secondary | ICD-10-CM

## 2022-03-17 MED ORDER — KETOROLAC 30 MG/ML (1 ML) IJ SOLN
INTRAVENOUS | 0 refills | Status: DC
Start: 2022-03-17 — End: 2022-03-17
  Administered 2022-03-17: 17:00:00 15 mg via INTRAVENOUS

## 2022-03-17 MED ORDER — PROPOFOL INJ 10 MG/ML IV VIAL
INTRAVENOUS | 0 refills | Status: DC
Start: 2022-03-17 — End: 2022-03-17
  Administered 2022-03-17: 15:00:00 100 mg via INTRAVENOUS
  Administered 2022-03-17: 15:00:00 40 mg via INTRAVENOUS

## 2022-03-17 MED ORDER — MIDAZOLAM 1 MG/ML IJ SOLN
INTRAVENOUS | 0 refills | Status: DC
Start: 2022-03-17 — End: 2022-03-17
  Administered 2022-03-17: 15:00:00 2 mg via INTRAVENOUS

## 2022-03-17 MED ORDER — CEFAZOLIN 1 GRAM IJ SOLR
INTRAVENOUS | 0 refills | Status: DC
Start: 2022-03-17 — End: 2022-03-17
  Administered 2022-03-17: 15:00:00 2 g via INTRAVENOUS

## 2022-03-17 MED ORDER — SUCCINYLCHOLINE CHLORIDE 20 MG/ML IJ SOLN
INTRAVENOUS | 0 refills | Status: DC
Start: 2022-03-17 — End: 2022-03-17
  Administered 2022-03-17: 15:00:00 60 mg via INTRAVENOUS

## 2022-03-17 MED ORDER — PHENYLEPHRINE 40 MCG/ML IN NS IV DRIP (STD CONC)
INTRAVENOUS | 0 refills | Status: DC
Start: 2022-03-17 — End: 2022-03-17
  Administered 2022-03-17 (×2): .5 ug/kg/min via INTRAVENOUS

## 2022-03-17 MED ORDER — PROPOFOL 10 MG/ML IV EMUL 100 ML (INFUSION)(AM)(OR)
INTRAVENOUS | 0 refills | Status: DC
Start: 2022-03-17 — End: 2022-03-17
  Administered 2022-03-17: 15:00:00 150 ug/kg/min via INTRAVENOUS

## 2022-03-17 MED ORDER — FENTANYL CITRATE (PF) 50 MCG/ML IJ SOLN
INTRAVENOUS | 0 refills | Status: DC
Start: 2022-03-17 — End: 2022-03-17
  Administered 2022-03-17: 15:00:00 50 ug via INTRAVENOUS

## 2022-03-17 MED ORDER — ARTIFICIAL TEARS (PF) SINGLE DOSE DROPS GROUP
OPHTHALMIC | 0 refills | Status: DC
Start: 2022-03-17 — End: 2022-03-17
  Administered 2022-03-17: 15:00:00 2 [drp] via OPHTHALMIC

## 2022-03-17 MED ORDER — ONDANSETRON HCL (PF) 4 MG/2 ML IJ SOLN
INTRAVENOUS | 0 refills | Status: DC
Start: 2022-03-17 — End: 2022-03-17
  Administered 2022-03-17: 17:00:00 4 mg via INTRAVENOUS

## 2022-03-17 MED ORDER — REMIFENTANYL 1000MCG IN NS 20ML (OR)
INTRAVENOUS | 0 refills | Status: DC
Start: 2022-03-17 — End: 2022-03-17
  Administered 2022-03-17 (×2): .07 ug/kg/min via INTRAVENOUS

## 2022-03-17 MED ORDER — DEXAMETHASONE SODIUM PHOSPHATE 4 MG/ML IJ SOLN
INTRAVENOUS | 0 refills | Status: DC
Start: 2022-03-17 — End: 2022-03-17
  Administered 2022-03-17: 15:00:00 4 mg via INTRAVENOUS

## 2022-03-17 MED ORDER — LIDOCAINE (PF) 200 MG/10 ML (2 %) IJ SYRG
INTRAVENOUS | 0 refills | Status: DC
Start: 2022-03-17 — End: 2022-03-17
  Administered 2022-03-17: 15:00:00 80 mg via INTRAVENOUS

## 2022-03-17 MED ADMIN — EPINEPHRINE 1 MG/ML IJ SOLN [2850]: 1 mg | INTRAVENOUS | @ 15:00:00 | Stop: 2022-03-17 | NDC 42023016801

## 2022-03-17 MED ADMIN — OXYCODONE 5 MG PO TAB [10814]: 5 mg | ORAL | @ 18:00:00 | Stop: 2022-03-17 | NDC 47781026305

## 2022-03-17 MED ADMIN — SODIUM CHLORIDE 0.9 % IV SOLP [27838]: 1000 mL | INTRAVENOUS | @ 14:00:00 | Stop: 2022-03-17 | NDC 00338004904

## 2022-03-17 MED ADMIN — OFLOXACIN 0.3 % OP DROP [19746]: 1 [drp] | OPHTHALMIC | @ 15:00:00 | Stop: 2022-03-17 | NDC 64980051505

## 2022-03-17 MED ADMIN — LIDOCAINE-EPINEPHRINE 1 %-1:100,000 IJ SOLN [15955]: 7 mL | INTRAMUSCULAR | @ 16:00:00 | Stop: 2022-03-17 | NDC 00409317817

## 2022-03-17 MED ADMIN — CEFAZOLIN 1 GRAM IJ SOLR [1445]: 4000 mg | INTRAVENOUS | @ 15:00:00 | Stop: 2022-03-17 | NDC 00143992490

## 2022-03-17 MED ADMIN — MUPIROCIN 2 % TP OINT [10674]: 1 | TOPICAL | @ 17:00:00 | Stop: 2022-03-17 | NDC 45802011222

## 2022-03-19 ENCOUNTER — Encounter: Admit: 2022-03-19 | Discharge: 2022-03-19 | Payer: MEDICARE

## 2022-03-19 DIAGNOSIS — F419 Anxiety disorder, unspecified: Secondary | ICD-10-CM

## 2022-03-19 DIAGNOSIS — Q899 Congenital malformation, unspecified: Secondary | ICD-10-CM

## 2022-03-19 DIAGNOSIS — H9190 Unspecified hearing loss, unspecified ear: Secondary | ICD-10-CM

## 2022-03-19 DIAGNOSIS — J302 Other seasonal allergic rhinitis: Secondary | ICD-10-CM

## 2022-03-19 DIAGNOSIS — E119 Type 2 diabetes mellitus without complications: Secondary | ICD-10-CM

## 2022-03-20 ENCOUNTER — Encounter: Admit: 2022-03-20 | Discharge: 2022-03-20 | Payer: MEDICARE

## 2022-03-20 MED ORDER — ACETAMINOPHEN 325 MG PO TAB
650 mg | ORAL_TABLET | ORAL | 3 refills | Status: AC | PRN
Start: 2022-03-20 — End: ?

## 2022-03-20 NOTE — Telephone Encounter
Pt's mom left VM and stated that pt lives in group home, and in order to administer any medications, including tylenol, they need it sent to the pharmacy she is with. Pharmacy is [facility]. Mom wants to know if Dr. Juel Burrow can prescribe tylenol twice a day for headaches from surgery to that pharmacy. Mom can be reached at [phone].

## 2022-03-20 NOTE — Telephone Encounter
Rx for Tylenol e-scribed.

## 2022-03-21 ENCOUNTER — Encounter: Admit: 2022-03-21 | Discharge: 2022-03-21 | Payer: MEDICARE

## 2022-03-22 ENCOUNTER — Encounter: Admit: 2022-03-22 | Discharge: 2022-03-22 | Payer: MEDICARE

## 2022-04-05 ENCOUNTER — Ambulatory Visit: Admit: 2022-04-05 | Discharge: 2022-04-05 | Payer: MEDICARE

## 2022-04-05 ENCOUNTER — Encounter: Admit: 2022-04-05 | Discharge: 2022-04-05 | Payer: MEDICARE

## 2022-04-05 DIAGNOSIS — F419 Anxiety disorder, unspecified: Secondary | ICD-10-CM

## 2022-04-05 DIAGNOSIS — Q899 Congenital malformation, unspecified: Secondary | ICD-10-CM

## 2022-04-05 DIAGNOSIS — E119 Type 2 diabetes mellitus without complications: Secondary | ICD-10-CM

## 2022-04-05 DIAGNOSIS — J302 Other seasonal allergic rhinitis: Secondary | ICD-10-CM

## 2022-04-05 DIAGNOSIS — H9190 Unspecified hearing loss, unspecified ear: Secondary | ICD-10-CM

## 2022-04-05 DIAGNOSIS — H7193 Unspecified cholesteatoma, bilateral: Secondary | ICD-10-CM

## 2022-04-05 NOTE — Progress Notes
Date of Service: 04/05/2022    Subjective:             Kimberly Sullivan is a 51 y.o. female.    History of Present Illness  Kimberly Sullivan returns after right staged tympanoplasty with mastoidectomy.  Silastic is in place.  She is doing well.       Review of Systems   Constitutional: Negative.    HENT: Negative.         Right ear ear tender   Eyes: Negative.    Respiratory: Negative.    Cardiovascular: Negative.    Gastrointestinal: Negative.    Endocrine: Negative.    Genitourinary: Negative.    Musculoskeletal: Negative.    Skin: Negative.    Allergic/Immunologic: Negative.    Neurological: Negative.    Hematological: Negative.    Psychiatric/Behavioral: Negative.          Objective:         ? acetaminophen (TYLENOL) 325 mg tablet Take two tablets by mouth every 4 hours as needed for Pain.   ? calcium carbonate (TUMS PO) Take  by mouth.   ? fenofibrate nanocrystallized (TRICOR) 145 mg tablet    ? fluticasone propionate (FLONASE) 50 mcg/actuation nasal spray, suspension Apply two sprays to each nostril as directed daily as needed. Shake bottle gently before using.   ? glimepiride (AMARYL) 4 mg tablet    ? loratadine (CLARITIN) 10 mg tablet    ? metFORMIN (GLUCOPHAGE) 1,000 mg tablet    ? montelukast (SINGULAIR) 10 mg tablet    ? multivitamin (DAILY VITAMINS PO) Take  by mouth.   ? ofloxacin (FLOXIN) 0.3 % otic solution Beginning 2 weeks after surgery, start up 5 drops twice a day to the right ear.   ? ofloxacin (FLOXIN) 0.3 % otic solution Beginning 2 weeks after surgery, start up 5 drops twice a day to the left ear.   ? PARoxetine HCL (PAXIL) 30 mg tablet      Vitals:    04/05/22 0757   BP: 128/82   Pulse: 79   PainSc: Zero   Weight: 67.6 kg (149 lb)   Height: 162.6 cm (5' 4)     Body mass index is 25.58 kg/m?Marland Kitchen     Physical Exam  Exam today under microscopy reveals a well-healing incision, minimal packing in the ear canal, the eardrum looks intact, it is raw in the ear canal.  I did powder it.       Assessment and Plan:  Kimberly Sullivan returns after right staged tympanoplasty with mastoidectomy.  Silastic is in place.  She is doing well.    Exam today under microscopy reveals a well-healing incision, minimal packing in the ear canal, the eardrum looks intact, it is raw in the ear canal.  I did powder it.    She will continue to keep the ear dry I will have her return in 4 to 5 weeks to check for healing.

## 2022-05-01 ENCOUNTER — Encounter: Admit: 2022-05-01 | Discharge: 2022-05-01 | Payer: MEDICARE

## 2022-05-10 ENCOUNTER — Ambulatory Visit: Admit: 2022-05-10 | Discharge: 2022-05-10 | Payer: MEDICARE

## 2022-05-10 ENCOUNTER — Encounter: Admit: 2022-05-10 | Discharge: 2022-05-10 | Payer: MEDICARE

## 2022-05-10 DIAGNOSIS — F419 Anxiety disorder, unspecified: Secondary | ICD-10-CM

## 2022-05-10 DIAGNOSIS — H9190 Unspecified hearing loss, unspecified ear: Secondary | ICD-10-CM

## 2022-05-10 DIAGNOSIS — Q899 Congenital malformation, unspecified: Secondary | ICD-10-CM

## 2022-05-10 DIAGNOSIS — E119 Type 2 diabetes mellitus without complications: Secondary | ICD-10-CM

## 2022-05-10 DIAGNOSIS — J302 Other seasonal allergic rhinitis: Secondary | ICD-10-CM

## 2022-05-10 DIAGNOSIS — H7193 Unspecified cholesteatoma, bilateral: Secondary | ICD-10-CM

## 2022-05-10 NOTE — Progress Notes
Date of Service: 05/10/2022    Subjective:             Kimberly Sullivan is a 51 y.o. female.    History of Present Illness  Kimberly Sullivan returns after right stage tympanoplasty with mastoidectomy with Silastic placement.  Recall that she had cholesteatoma on both sides.  She is doing okay.     Review of Systems   Constitutional: Negative.    HENT: Positive for hearing loss.    Eyes: Negative.    Respiratory: Negative.    Cardiovascular: Negative.    Gastrointestinal: Negative.    Endocrine: Negative.    Genitourinary: Negative.    Musculoskeletal: Negative.    Skin: Negative.    Allergic/Immunologic: Negative.    Neurological: Negative.    Hematological: Negative.    Psychiatric/Behavioral: Negative.          Objective:         ? acetaminophen (TYLENOL) 325 mg tablet Take two tablets by mouth every 4 hours as needed for Pain.   ? calcium carbonate (TUMS PO) Take  by mouth.   ? fenofibrate nanocrystallized (TRICOR) 145 mg tablet    ? fluticasone propionate (FLONASE) 50 mcg/actuation nasal spray, suspension Apply two sprays to each nostril as directed daily as needed. Shake bottle gently before using.   ? glimepiride (AMARYL) 4 mg tablet    ? loratadine (CLARITIN) 10 mg tablet    ? metFORMIN (GLUCOPHAGE) 1,000 mg tablet    ? montelukast (SINGULAIR) 10 mg tablet    ? multivitamin (DAILY VITAMINS PO) Take  by mouth.   ? ofloxacin (FLOXIN) 0.3 % otic solution Beginning 2 weeks after surgery, start up 5 drops twice a day to the right ear.   ? ofloxacin (FLOXIN) 0.3 % otic solution Beginning 2 weeks after surgery, start up 5 drops twice a day to the left ear.   ? PARoxetine HCL (PAXIL) 30 mg tablet      Vitals:    05/10/22 1140   BP: 131/85   Pulse: 81   PainSc: Zero   Weight: 67.9 kg (149 lb 12.8 oz)   Height: 162.6 cm (5' 4)     Body mass index is 25.71 kg/m?Marland Kitchen     Physical Exam    Under microscopy the incision is healing well or fully healed.  Eardrum is intact and dry and retracted on the Silastic.       Assessment and Plan:  Kimberly Sullivan returns after right stage tympanoplasty with mastoidectomy with Silastic placement.  Recall that she had cholesteatoma on both sides.  She is doing okay.    Under microscopy the incision is healing well or fully healed.  Eardrum is intact and dry and retracted on the Silastic.    She can stop any drops and get water in her ear.  I will have her return in about 6 to 8 weeks with a repeat hearing test and plan for second look.

## 2022-07-19 ENCOUNTER — Encounter: Admit: 2022-07-19 | Discharge: 2022-07-19 | Payer: MEDICARE

## 2022-07-19 ENCOUNTER — Ambulatory Visit: Admit: 2022-07-19 | Discharge: 2022-07-19 | Payer: MEDICARE

## 2022-07-19 DIAGNOSIS — H9190 Unspecified hearing loss, unspecified ear: Secondary | ICD-10-CM

## 2022-07-19 DIAGNOSIS — Q899 Congenital malformation, unspecified: Secondary | ICD-10-CM

## 2022-07-19 DIAGNOSIS — F419 Anxiety disorder, unspecified: Secondary | ICD-10-CM

## 2022-07-19 DIAGNOSIS — H6123 Impacted cerumen, bilateral: Secondary | ICD-10-CM

## 2022-07-19 DIAGNOSIS — E119 Type 2 diabetes mellitus without complications: Secondary | ICD-10-CM

## 2022-07-19 DIAGNOSIS — H906 Mixed conductive and sensorineural hearing loss, bilateral: Secondary | ICD-10-CM

## 2022-07-19 DIAGNOSIS — H7123 Cholesteatoma of mastoid, bilateral: Secondary | ICD-10-CM

## 2022-07-19 DIAGNOSIS — J302 Other seasonal allergic rhinitis: Secondary | ICD-10-CM

## 2022-07-19 NOTE — Progress Notes
Date of Service: 07/19/2022    Subjective:             Kimberly Sullivan is a 51 y.o. female.    History of Present Illness  Kimberly Sullivan returns after a right staged tympanoplasty with mastoidectomy.  Doing okay; hearing down on the right side.         Review of Systems   Constitutional: Negative.    HENT: Negative.    Eyes: Negative.    Respiratory: Negative.    Cardiovascular: Negative.    Gastrointestinal: Negative.    Endocrine: Negative.    Genitourinary: Negative.    Musculoskeletal: Negative.    Skin: Negative.    Allergic/Immunologic: Negative.    Neurological: Negative.    Hematological: Negative.    Psychiatric/Behavioral: Negative.          Objective:         ? acetaminophen (TYLENOL) 325 mg tablet Take two tablets by mouth every 4 hours as needed for Pain.   ? calcium carbonate (TUMS PO) Take  by mouth.   ? fenofibrate nanocrystallized (TRICOR) 145 mg tablet    ? fluticasone propionate (FLONASE) 50 mcg/actuation nasal spray, suspension Apply two sprays to each nostril as directed daily as needed. Shake bottle gently before using.   ? glimepiride (AMARYL) 4 mg tablet    ? loratadine (CLARITIN) 10 mg tablet    ? metFORMIN (GLUCOPHAGE) 1,000 mg tablet    ? montelukast (SINGULAIR) 10 mg tablet    ? multivitamin (DAILY VITAMINS PO) Take  by mouth.   ? ofloxacin (FLOXIN) 0.3 % otic solution Beginning 2 weeks after surgery, start up 5 drops twice a day to the right ear.   ? ofloxacin (FLOXIN) 0.3 % otic solution Beginning 2 weeks after surgery, start up 5 drops twice a day to the left ear.   ? PARoxetine HCL (PAXIL) 30 mg tablet      Vitals:    07/19/22 1144   BP: 116/78   Pulse: 87   PainSc: Zero   Weight: 67.9 kg (149 lb 12.8 oz)   Height: 162.6 cm (5' 4)     Body mass index is 25.71 kg/m?Marland Kitchen     Physical Exam    Exam under microscopy reveals cerumen impaction both sides, removed with curette.  Left side is dry with some retraction superiorly around the cartilage; no debris.  Right side is sealed, dry, intact. Audiogram from today reviewed.     Assessment and Plan:  Kimberly Sullivan returns after a right staged tympanoplasty with mastoidectomy.  Doing okay; hearing down on the right side.      Exam under microscopy reveals cerumen impaction both sides, removed with curette.  Left side is dry with some retraction superiorly around the cartilage; no debris.  Right side is sealed, dry, intact.  Audiogram from today reviewed.    We'll plan on a 2nd look right tympanoplasty with mastoidectomy, ossicular chain reconstruction.

## 2022-08-07 ENCOUNTER — Encounter: Admit: 2022-08-07 | Discharge: 2022-08-07 | Payer: MEDICARE

## 2022-08-07 NOTE — Telephone Encounter
Mother called and ready to schedule surgery with Dr. Aron Baba nurse.

## 2022-08-08 ENCOUNTER — Encounter: Admit: 2022-08-08 | Discharge: 2022-08-08 | Payer: MEDICARE

## 2022-08-08 ENCOUNTER — Ambulatory Visit: Admit: 2022-08-08 | Discharge: 2022-08-08 | Payer: MEDICARE

## 2022-08-08 DIAGNOSIS — H7191 Unspecified cholesteatoma, right ear: Secondary | ICD-10-CM

## 2022-11-07 ENCOUNTER — Encounter: Admit: 2022-11-07 | Discharge: 2022-11-07 | Payer: MEDICARE

## 2022-11-07 DIAGNOSIS — Q899 Congenital malformation, unspecified: Secondary | ICD-10-CM

## 2022-11-07 DIAGNOSIS — H9190 Unspecified hearing loss, unspecified ear: Secondary | ICD-10-CM

## 2022-11-07 DIAGNOSIS — E119 Type 2 diabetes mellitus without complications: Secondary | ICD-10-CM

## 2022-11-07 DIAGNOSIS — J302 Other seasonal allergic rhinitis: Secondary | ICD-10-CM

## 2022-11-07 DIAGNOSIS — F419 Anxiety disorder, unspecified: Secondary | ICD-10-CM

## 2022-12-03 IMAGING — MG MM mammogram 3D screen bilat
9 series · 10 of 25 positions shown · non-contrast
Comparison: none

[R CC]
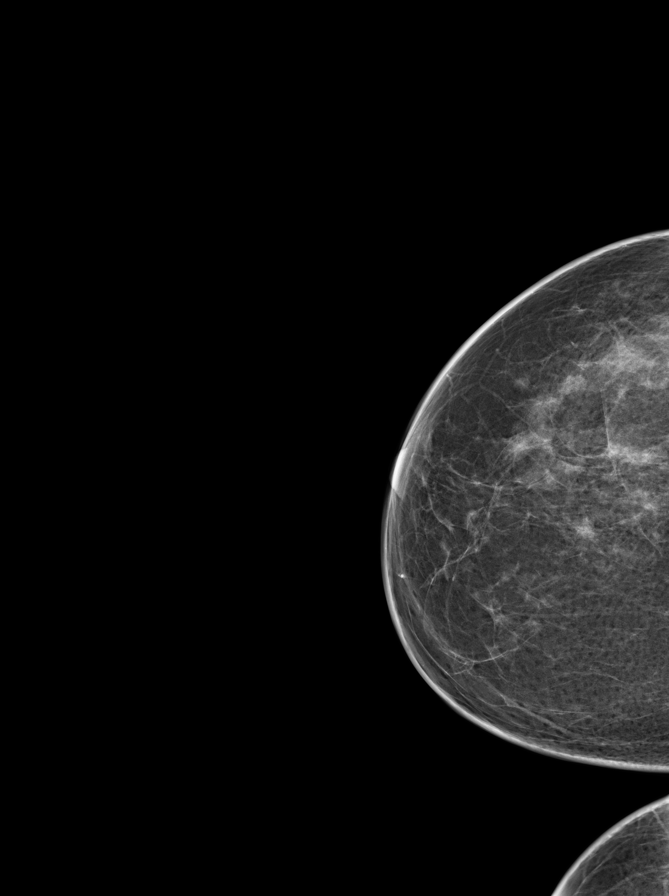

[Series 4: BTO_TOMO R-CC PRIME, EMPIRE_C.97/112, Sc tomo · 2 of 53 frames shown]
[frame 18/53]
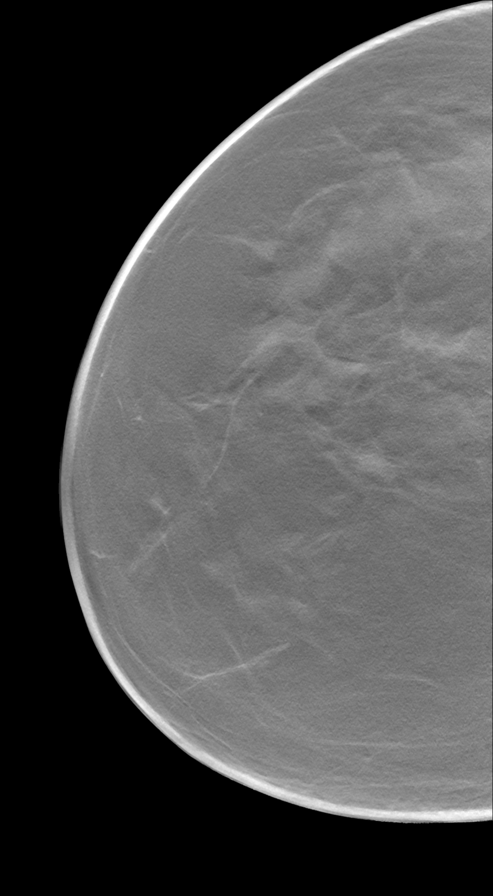
[frame 27/53]
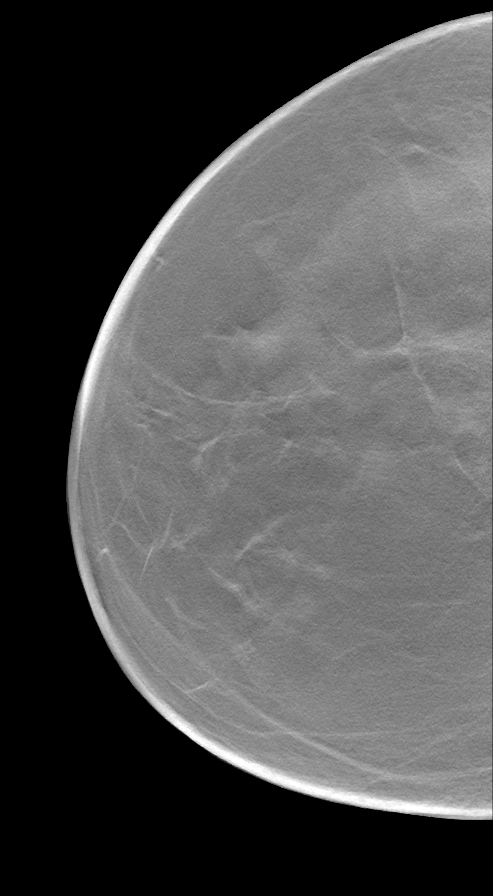

[L CC]
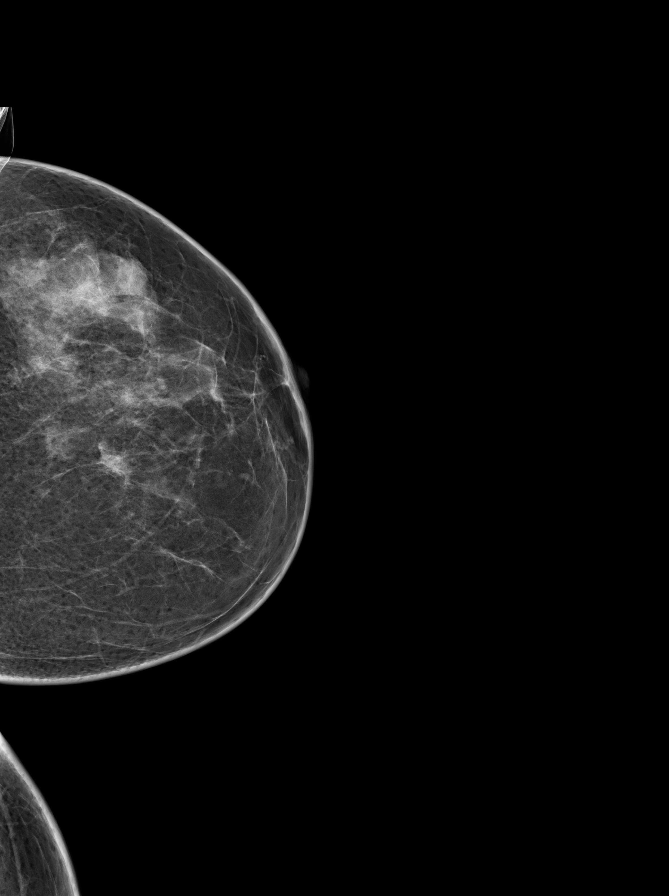

[BTO_TOMO L-CC PRIME, EMPIRE_C.97/112, Sc tomo · tomo slice 27/52.0]
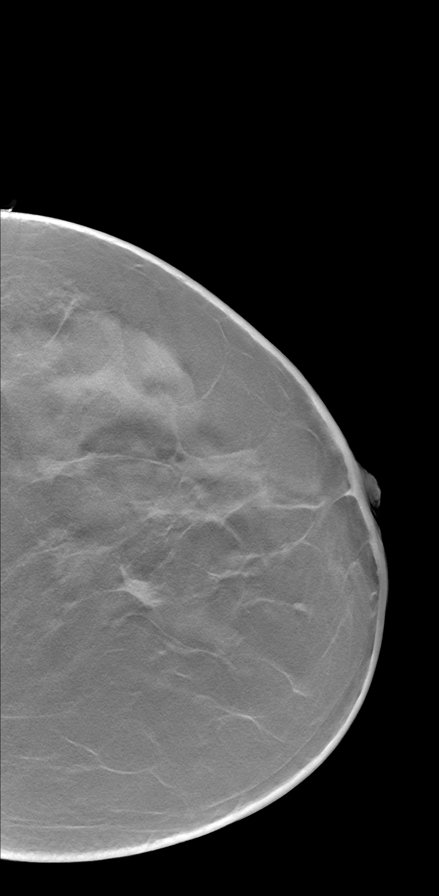

[L XCC]
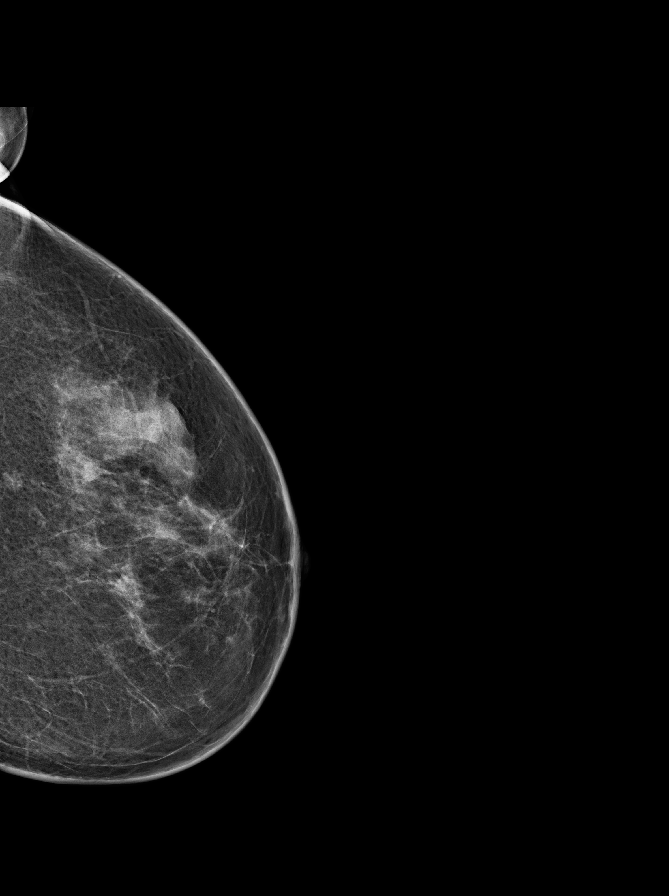

[R MLO]
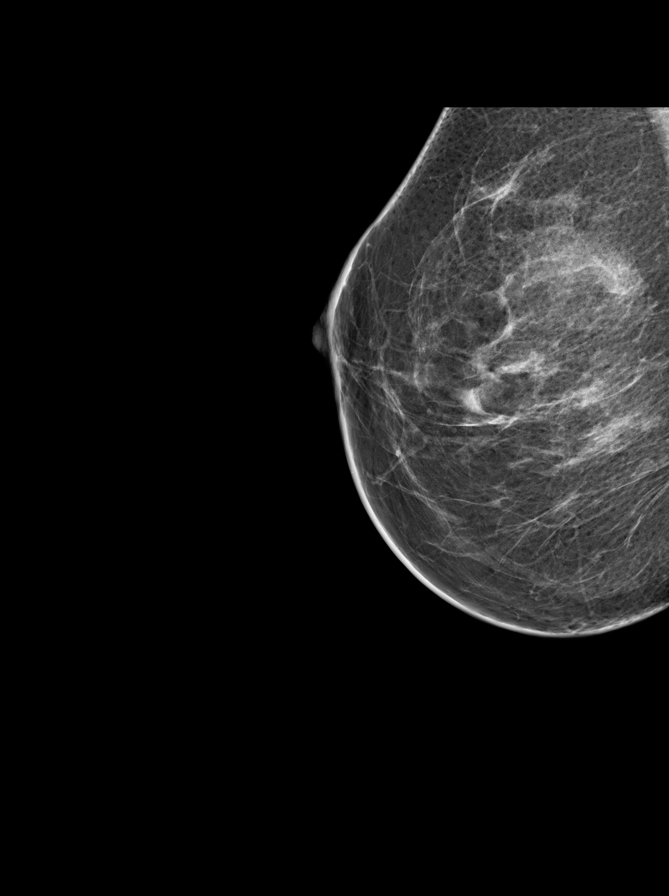

[BTO_TOMO R-MLO PRIME, EMPIRE_C.97/112, S tomo · tomo slice 27/52.0]
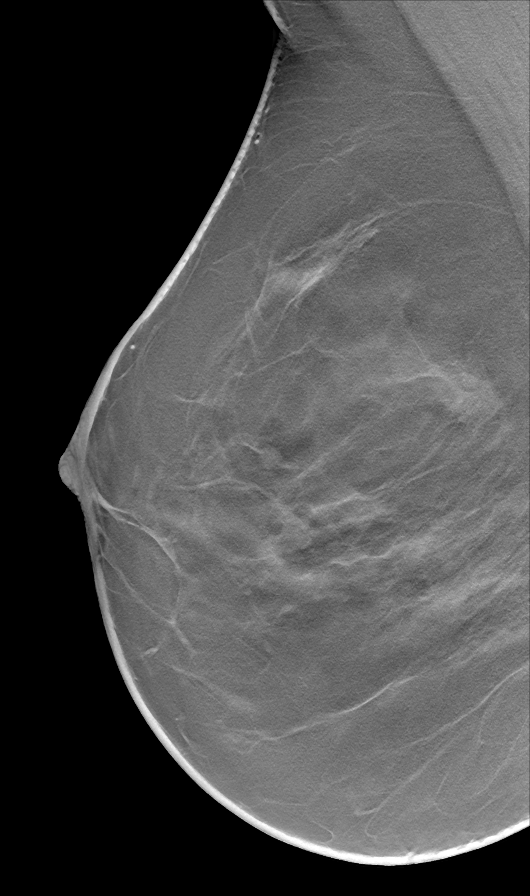

[L MLO]
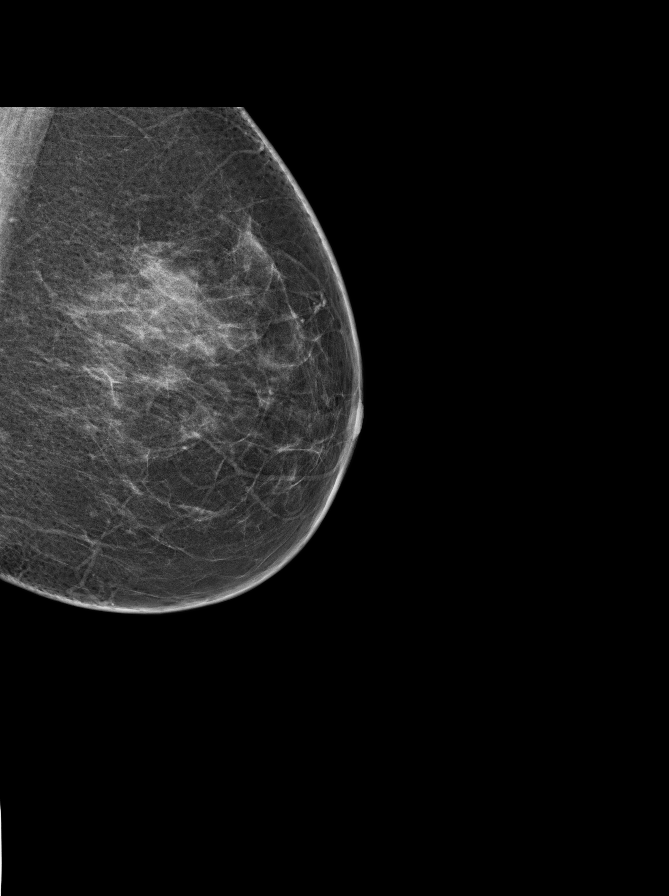

[BTO_TOMO L-MLO PRIME, EMPIRE_C.97/112, S tomo · tomo slice 25/49.0]
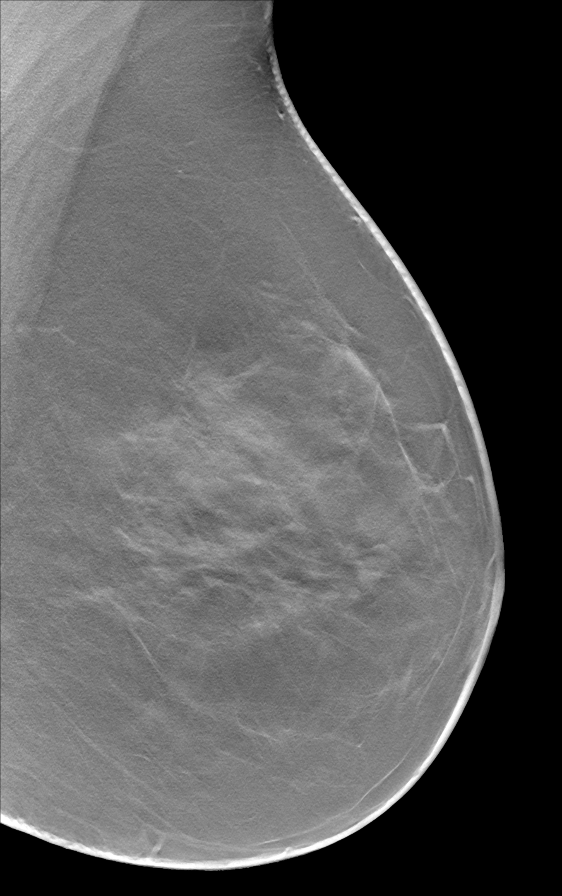

[10 of 25 positions shown; findings below may reference images not displayed]

EXAM

Digital screening mammography with breast tomosynthesis

INDICATION

screening
NULLIPARITY.  PT NOT A GOOD HISTORIAN.  SCREENING.  AB (3D) PRIORS: [id number].

FINDINGS

Comparison study dated 08/17/2020 and 07/16/2019.

Digital 2D CC and MLO projections obtained with 3D tomographic views per manufacturer's protocol.

There are scattered areas of fibroglandular density bilaterally. An asymmetric density in the deep
central portion of the left breast appears unchanged from prior studies. There are no dominant
masses or suspicious calcifications.

IMPRESSION

Benign mammogram. BI-RADS 2. Screening mammography in 1 year is recommended. A a letter regarding
results and followup scheduling will be sent.

Tech Notes:

## 2022-12-13 ENCOUNTER — Encounter: Admit: 2022-12-13 | Discharge: 2022-12-13 | Payer: MEDICARE

## 2022-12-13 NOTE — Telephone Encounter
Returned call to [place] with no answer. Left voicemail requesting a call back to further inquire on her recent missed call regarding medication cancellation.

## 2022-12-13 NOTE — Telephone Encounter
Attempted to return Lisa's call with no answer. Once again left voicemail requesting a call back

## 2022-12-13 NOTE — Telephone Encounter
Received call back from [place] with [facility] who states since patient resides in an ALF they are unable to have PRN medications and they still have Acetaminophen on file from Dr. Augustin Coupe.    Acetaminophen 325 mg tablets- take two tablets PO every 4 hours PRN pain for post-surgical pain management s/p 03/17/22 tympanoplasty discontinued. Electronic D/C order sent to pharmacy.

## 2022-12-13 NOTE — Telephone Encounter
Lattie Haw with [facility] called her p# [phone]

## 2022-12-13 NOTE — Telephone Encounter
Lattie Haw from [facility] calling to discontinue medication of PT. Please call back for further info at [phone]. Thank you!

## 2022-12-21 ENCOUNTER — Encounter: Admit: 2022-12-21 | Discharge: 2022-12-21 | Payer: MEDICARE

## 2022-12-22 ENCOUNTER — Encounter: Admit: 2022-12-22 | Discharge: 2022-12-22 | Payer: MEDICARE

## 2022-12-22 ENCOUNTER — Ambulatory Visit: Admit: 2022-12-22 | Discharge: 2022-12-22 | Payer: MEDICARE

## 2022-12-22 DIAGNOSIS — H9190 Unspecified hearing loss, unspecified ear: Secondary | ICD-10-CM

## 2022-12-22 DIAGNOSIS — F419 Anxiety disorder, unspecified: Secondary | ICD-10-CM

## 2022-12-22 DIAGNOSIS — E119 Type 2 diabetes mellitus without complications: Secondary | ICD-10-CM

## 2022-12-22 DIAGNOSIS — Q899 Congenital malformation, unspecified: Secondary | ICD-10-CM

## 2022-12-22 DIAGNOSIS — J302 Other seasonal allergic rhinitis: Secondary | ICD-10-CM

## 2022-12-22 MED ORDER — PHENYLEPHRINE 40 MCG/ML IN NS IV DRIP (STD CONC)
INTRAVENOUS | 0 refills | Status: DC
Start: 2022-12-22 — End: 2022-12-22
  Administered 2022-12-22 (×2): .4 ug/kg/min via INTRAVENOUS

## 2022-12-22 MED ORDER — DEXAMETHASONE SODIUM PHOSPHATE 4 MG/ML IJ SOLN
INTRAVENOUS | 0 refills | Status: DC
Start: 2022-12-22 — End: 2022-12-22

## 2022-12-22 MED ORDER — GLYCOPYRROLATE 0.2 MG/ML IJ SOLN
INTRAVENOUS | 0 refills | Status: DC
Start: 2022-12-22 — End: 2022-12-22

## 2022-12-22 MED ORDER — CEFAZOLIN 1 GRAM IJ SOLR
INTRAVENOUS | 0 refills | Status: DC
Start: 2022-12-22 — End: 2022-12-22

## 2022-12-22 MED ORDER — SUCCINYLCHOLINE CHLORIDE 20 MG/ML IJ SOLN
INTRAVENOUS | 0 refills | Status: DC
Start: 2022-12-22 — End: 2022-12-22

## 2022-12-22 MED ORDER — PROPOFOL INJ 10 MG/ML IV VIAL
INTRAVENOUS | 0 refills | Status: DC
Start: 2022-12-22 — End: 2022-12-22

## 2022-12-22 MED ORDER — LIDOCAINE (PF) 200 MG/10 ML (2 %) IJ SYRG
INTRAVENOUS | 0 refills | Status: DC
Start: 2022-12-22 — End: 2022-12-22

## 2022-12-22 MED ORDER — DEXMEDETOMIDINE IN 0.9 % NACL 20 MCG/5 ML (4 MCG/ML) IV SYRG
INTRAVENOUS | 0 refills | Status: DC
Start: 2022-12-22 — End: 2022-12-22

## 2022-12-22 MED ORDER — MIDAZOLAM 1 MG/ML IJ SOLN
INTRAVENOUS | 0 refills | Status: DC
Start: 2022-12-22 — End: 2022-12-22

## 2022-12-22 MED ORDER — REMIFENTANYL 1000MCG IN NS 20ML (OR)
INTRAVENOUS | 0 refills | Status: DC
Start: 2022-12-22 — End: 2022-12-22
  Administered 2022-12-22 (×2): .08 ug/kg/min via INTRAVENOUS

## 2022-12-22 MED ORDER — ARTIFICIAL TEARS (PF) SINGLE DOSE DROPS GROUP
OPHTHALMIC | 0 refills | Status: DC
Start: 2022-12-22 — End: 2022-12-22

## 2022-12-22 MED ORDER — ONDANSETRON HCL (PF) 4 MG/2 ML IJ SOLN
INTRAVENOUS | 0 refills | Status: DC
Start: 2022-12-22 — End: 2022-12-22

## 2022-12-22 MED ORDER — KETOROLAC 30 MG/ML (1 ML) IJ SOLN
INTRAVENOUS | 0 refills | Status: DC
Start: 2022-12-22 — End: 2022-12-22

## 2022-12-22 MED ORDER — FENTANYL CITRATE (PF) 50 MCG/ML IJ SOLN
INTRAVENOUS | 0 refills | Status: DC
Start: 2022-12-22 — End: 2022-12-22

## 2022-12-22 MED ADMIN — LACTATED RINGERS IV SOLP [4318]: 1000 mL | INTRAVENOUS | @ 15:00:00 | Stop: 2022-12-22 | NDC 00338011704

## 2022-12-22 MED ADMIN — SODIUM CHLORIDE 0.9 % IR SOLN [11403]: 1000 mL | @ 18:00:00 | Stop: 2022-12-22 | NDC 00338004804

## 2022-12-22 MED ADMIN — ACETAMINOPHEN 500 MG PO TAB [102]: 1000 mg | ORAL | @ 15:00:00 | Stop: 2022-12-22 | NDC 00904673061

## 2022-12-22 MED ADMIN — CEFAZOLIN 1 GRAM IJ SOLR [1445]: 3000 mL | @ 18:00:00 | Stop: 2022-12-22 | NDC 00143992490

## 2022-12-22 MED ADMIN — CEFAZOLIN 1 GRAM IJ SOLR [1445]: 1000 mL | @ 18:00:00 | Stop: 2022-12-22 | NDC 00143992490

## 2022-12-22 MED ADMIN — SODIUM CHLORIDE 0.9% IRRIGATION BAG [210330]: 3000 mL | @ 18:00:00 | Stop: 2022-12-22 | NDC 00338004747

## 2022-12-22 MED FILL — TRAMADOL 50 MG PO TAB: 50 mg | ORAL | 3 days supply | Qty: 8 | Fill #1 | Status: CP

## 2022-12-24 ENCOUNTER — Encounter: Admit: 2022-12-24 | Discharge: 2022-12-24 | Payer: MEDICARE

## 2022-12-24 DIAGNOSIS — E119 Type 2 diabetes mellitus without complications: Secondary | ICD-10-CM

## 2022-12-24 DIAGNOSIS — Q899 Congenital malformation, unspecified: Secondary | ICD-10-CM

## 2022-12-24 DIAGNOSIS — F419 Anxiety disorder, unspecified: Secondary | ICD-10-CM

## 2022-12-24 DIAGNOSIS — J302 Other seasonal allergic rhinitis: Secondary | ICD-10-CM

## 2022-12-24 DIAGNOSIS — H9190 Unspecified hearing loss, unspecified ear: Secondary | ICD-10-CM

## 2023-01-10 ENCOUNTER — Encounter: Admit: 2023-01-10 | Discharge: 2023-01-10 | Payer: MEDICARE

## 2023-01-10 ENCOUNTER — Ambulatory Visit: Admit: 2023-01-10 | Discharge: 2023-01-10 | Payer: MEDICARE

## 2023-01-10 DIAGNOSIS — H9190 Unspecified hearing loss, unspecified ear: Secondary | ICD-10-CM

## 2023-01-10 DIAGNOSIS — H7191 Unspecified cholesteatoma, right ear: Secondary | ICD-10-CM

## 2023-01-10 DIAGNOSIS — J302 Other seasonal allergic rhinitis: Secondary | ICD-10-CM

## 2023-01-10 DIAGNOSIS — Q899 Congenital malformation, unspecified: Secondary | ICD-10-CM

## 2023-01-10 DIAGNOSIS — F419 Anxiety disorder, unspecified: Secondary | ICD-10-CM

## 2023-01-10 DIAGNOSIS — E119 Type 2 diabetes mellitus without complications: Secondary | ICD-10-CM

## 2023-01-10 NOTE — Progress Notes
Date of Service: 01/10/2023    Subjective:             Kimberly Sullivan is a 52 y.o. female.    History of Present Illness    Kimberly Sullivan returns after a right 2nd look tympanoplasty with mastoidectomy.  Doing well.     Review of Systems   Constitutional: Negative.    HENT:  Positive for hearing loss.    Eyes: Negative.    Respiratory: Negative.     Cardiovascular: Negative.    Gastrointestinal: Negative.    Endocrine: Negative.    Genitourinary: Negative.    Musculoskeletal: Negative.    Skin: Negative.    Allergic/Immunologic: Negative.    Neurological: Negative.    Hematological: Negative.    Psychiatric/Behavioral: Negative.           Objective:          acetaminophen (TYLENOL 8 HOUR PO) Take  by mouth as Needed.    calcium carbonate (TUMS PO) Take  by mouth.    fenofibrate nanocrystallized (TRICOR) 145 mg tablet     fluticasone propionate (FLONASE) 50 mcg/actuation nasal spray, suspension Apply two sprays to each nostril as directed daily as needed. Shake bottle gently before using.    glimepiride (AMARYL) 4 mg tablet     loratadine (CLARITIN) 10 mg tablet     metFORMIN (GLUCOPHAGE) 1,000 mg tablet     montelukast (SINGULAIR) 10 mg tablet     multivitamin (DAILY VITAMINS PO) Take  by mouth.    ofloxacin (FLOXIN) 0.3 % otic solution Beginning 2 weeks after surgery, start up 5 drops twice a day to the right ear.    PARoxetine HCL (PAXIL) 30 mg tablet     traMADoL (ULTRAM) 50 mg tablet Take one tablet by mouth every 8 hours as needed.     Vitals:    01/10/23 1037   BP: 120/77   Pulse: 93   PainSc: Zero   Weight: 67.6 kg (149 lb)   Height: 162.6 cm (5' 4)     Body mass index is 25.58 kg/m?Marland Kitchen     Physical Exam      Incision healing well.  Most of the packing gone from right ear canal.  Drum intact; slightly moist.  Still edematous along vascular strip.     Assessment and Plan:  Kimberly Sullivan returns after a right 2nd look tympanoplasty with mastoidectomy.  Doing well.    Incision healing well.  Most of the packing gone from right ear canal.  Drum intact; slightly moist.  Still edematous along vascular strip.    Okay to stop drops, but continue to keep the right ear dry.  I'll have her return in 6-7 weeks.

## 2023-01-11 ENCOUNTER — Encounter: Admit: 2023-01-11 | Discharge: 2023-01-11 | Payer: MEDICARE

## 2023-02-28 ENCOUNTER — Encounter: Admit: 2023-02-28 | Discharge: 2023-02-28 | Payer: MEDICARE

## 2023-02-28 ENCOUNTER — Ambulatory Visit: Admit: 2023-02-28 | Discharge: 2023-02-28 | Payer: MEDICARE

## 2023-02-28 DIAGNOSIS — H7191 Unspecified cholesteatoma, right ear: Secondary | ICD-10-CM

## 2023-02-28 DIAGNOSIS — E119 Type 2 diabetes mellitus without complications: Secondary | ICD-10-CM

## 2023-02-28 DIAGNOSIS — H9190 Unspecified hearing loss, unspecified ear: Secondary | ICD-10-CM

## 2023-02-28 DIAGNOSIS — Q899 Congenital malformation, unspecified: Secondary | ICD-10-CM

## 2023-02-28 DIAGNOSIS — J302 Other seasonal allergic rhinitis: Secondary | ICD-10-CM

## 2023-02-28 DIAGNOSIS — F419 Anxiety disorder, unspecified: Secondary | ICD-10-CM

## 2023-02-28 NOTE — Progress Notes
Date of Service: 02/28/2023    Subjective:             Kimberly Sullivan is a 52 y.o. female.    History of Present Illness    Kimberly Sullivan returns after a right 2nd look tympanomastoidectomy.  Doing well.  Hearing not good.             Objective:         acetaminophen (TYLENOL 8 HOUR PO) Take  by mouth as Needed.    calcium carbonate (TUMS PO) Take  by mouth.    fenofibrate nanocrystallized (TRICOR) 145 mg tablet     fluticasone propionate (FLONASE) 50 mcg/actuation nasal spray, suspension Apply two sprays to each nostril as directed daily as needed. Shake bottle gently before using.    glimepiride (AMARYL) 4 mg tablet     loratadine (CLARITIN) 10 mg tablet     metFORMIN (GLUCOPHAGE) 1,000 mg tablet     montelukast (SINGULAIR) 10 mg tablet     multivitamin (DAILY VITAMINS PO) Take  by mouth.    PARoxetine HCL (PAXIL) 30 mg tablet      Vitals:    02/28/23 1015   BP: 119/82   Pulse: 91   PainSc: Zero   Weight: 68 kg (150 lb)   Height: 162.6 cm (5' 4)     Body mass index is 25.75 kg/m?Marland Kitchen     Physical Exam    Microscope reveals a right crust in ear canal removed and eardrum is intact with an effusion; dry.        Assessment and Plan:  Kimberly Sullivan returns after a right 2nd look tympanomastoidectomy.  Doing well.  Hearing not good.    Microscope reveals a right crust in ear canal removed and eardrum is intact with an effusion; dry.     No restrictions at this point.  I want her to try to valsalva if possible.  I'll have her return in 2-3 months with a repeat hearing test.

## 2023-05-09 ENCOUNTER — Ambulatory Visit: Admit: 2023-05-09 | Discharge: 2023-05-09 | Payer: MEDICARE

## 2023-05-09 ENCOUNTER — Encounter: Admit: 2023-05-09 | Discharge: 2023-05-09 | Payer: MEDICARE

## 2023-05-09 DIAGNOSIS — H9190 Unspecified hearing loss, unspecified ear: Secondary | ICD-10-CM

## 2023-05-09 DIAGNOSIS — H6123 Impacted cerumen, bilateral: Secondary | ICD-10-CM

## 2023-05-09 DIAGNOSIS — Q899 Congenital malformation, unspecified: Secondary | ICD-10-CM

## 2023-05-09 DIAGNOSIS — E119 Type 2 diabetes mellitus without complications: Secondary | ICD-10-CM

## 2023-05-09 DIAGNOSIS — H7123 Cholesteatoma of mastoid, bilateral: Secondary | ICD-10-CM

## 2023-05-09 DIAGNOSIS — J302 Other seasonal allergic rhinitis: Secondary | ICD-10-CM

## 2023-05-09 DIAGNOSIS — F419 Anxiety disorder, unspecified: Secondary | ICD-10-CM

## 2023-05-09 DIAGNOSIS — H906 Mixed conductive and sensorineural hearing loss, bilateral: Secondary | ICD-10-CM

## 2023-05-09 NOTE — Progress Notes
Date of Service: 05/09/2023    Subjective:             Kimberly Sullivan is a 52 y.o. female.    History of Present Illness    Kimberly Sullivan returns with bilateral cholesteatoma; she is status post surgeries on both sides.              Objective:         acetaminophen (TYLENOL 8 HOUR PO) Take  by mouth as Needed.    calcium carbonate (TUMS PO) Take  by mouth.    fenofibrate nanocrystallized (TRICOR) 145 mg tablet     fluticasone propionate (FLONASE) 50 mcg/actuation nasal spray, suspension Apply two sprays to each nostril as directed daily as needed. Shake bottle gently before using.    glimepiride (AMARYL) 4 mg tablet     loratadine (CLARITIN) 10 mg tablet     metFORMIN (GLUCOPHAGE) 1,000 mg tablet     montelukast (SINGULAIR) 10 mg tablet     multivitamin (DAILY VITAMINS PO) Take  by mouth.    PARoxetine HCL (PAXIL) 30 mg tablet      Vitals:    05/09/23 1007   BP: 131/81   Pulse: 83   PainSc: Zero   Weight: 68 kg (150 lb)   Height: 162.6 cm (5' 4)     Body mass index is 25.75 kg/m?Marland Kitchen     Physical Exam    Exam today reveals cerumen impaction that I removed with both sides with suction.  Left side has some attic retraction without debris superiorly around the cartilage; the right side is cartilage grafted retracted posteriorly and aerated anteriorly.  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         Assessment and Plan:  Kimberly Sullivan returns with bilateral cholesteatoma; she is status post surgeries on both sides.     Exam today reveals cerumen impaction that I removed with both sides with suction.  Left side has some attic retraction without debris superiorly around the cartilage; the right side is cartilage grafted retracted posteriorly and aerated anteriorly.  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      I recommend a trial of amplification and for her to return to see me back in 6-8 months with audiogram.

## 2024-03-20 ENCOUNTER — Encounter: Admit: 2024-03-20 | Discharge: 2024-03-20 | Payer: MEDICARE

## 2024-03-25 ENCOUNTER — Encounter: Admit: 2024-03-25 | Discharge: 2024-03-25 | Payer: MEDICARE

## 2024-03-25 ENCOUNTER — Ambulatory Visit: Admit: 2024-03-25 | Discharge: 2024-03-26 | Payer: MEDICARE

## 2024-03-25 ENCOUNTER — Ambulatory Visit: Admit: 2024-03-25 | Discharge: 2024-03-25 | Payer: MEDICARE

## 2024-03-25 DIAGNOSIS — H6123 Impacted cerumen, bilateral: Secondary | ICD-10-CM

## 2024-03-25 DIAGNOSIS — H7123 Cholesteatoma of mastoid, bilateral: Secondary | ICD-10-CM

## 2024-03-25 DIAGNOSIS — H906 Mixed conductive and sensorineural hearing loss, bilateral: Secondary | ICD-10-CM

## 2024-03-25 MED ORDER — MISCELLANEOUS MEDICAL SUPPLY MISC MISC
11 refills | 1.00000 days | Status: AC
Start: 2024-03-25 — End: ?

## 2024-03-25 NOTE — Progress Notes
 Date of Service: 03/25/2024    Subjective:             Zaryia Markel is a 53 y.o. female.    History of Present Illness    Asencion returns with bilateral cholesteatomas; she is status post surgery on both sides.  She's had some buildup on both sides and she uses amplification.             Objective:         acetaminophen  (TYLENOL  8 HOUR PO) Take  by mouth as Needed.    calcium carbonate (TUMS PO) Take  by mouth.    fenofibrate nanocrystallized (TRICOR) 145 mg tablet     fluticasone propionate (FLONASE) 50 mcg/actuation nasal spray, suspension Apply two sprays to each nostril as directed daily as needed. Shake bottle gently before using.    glimepiride (AMARYL) 4 mg tablet     loratadine (CLARITIN) 10 mg tablet     metFORMIN (GLUCOPHAGE) 1,000 mg tablet     montelukast (SINGULAIR) 10 mg tablet     multivitamin (DAILY VITAMINS PO) Take  by mouth.    PARoxetine HCL (PAXIL) 30 mg tablet      Vitals:    03/25/24 1417   BP: 135/82   BP Source: Arm, Left Upper   Pulse: 109   Temp: 36.3 ?C (97.3 ?F)   TempSrc: Temporal   PainSc: Zero   Weight: 68 kg (150 lb)   Height: 162.6 cm (5' 4)     Body mass index is 25.75 kg/m?SABRA     Physical Exam      Exam under microscopy reveals cerumen impaction, dry, right side I removed with a curette; eardrum intact and sealed; some retraction above cartilage.  Left side has moist cerumen impaction, removed with suction; some attic retraction with debris, debulked.  Powdered.  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     Assessment and Plan:  Anelia returns with bilateral cholesteatomas; she is status post surgery on both sides.  She's had some buildup on both sides and she uses amplification.    Exam under microscopy reveals cerumen impaction, dry, right side I removed with a curette; eardrum intact and sealed; some retraction above cartilage.  Left side has moist cerumen impaction, removed with suction; some attic retraction with debris, debulked.  Powdered.  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    She has eustachian tube dysfunction and retraction of the eardrums; I'll have her get mineral oil, 5 drops in each ear three times a week (MWF) that should be done at her facility.  I'll have her return in 4-5 months.

## 2024-05-28 ENCOUNTER — Encounter: Admit: 2024-05-28 | Discharge: 2024-05-28 | Payer: MEDICARE

## 2024-08-11 ENCOUNTER — Encounter: Admit: 2024-08-11 | Discharge: 2024-08-11 | Payer: MEDICARE

## 2024-08-12 ENCOUNTER — Encounter: Admit: 2024-08-12 | Discharge: 2024-08-12 | Payer: MEDICARE

## 2024-08-12 ENCOUNTER — Ambulatory Visit: Admit: 2024-08-12 | Discharge: 2024-08-13 | Payer: MEDICARE

## 2024-08-12 VITALS — BP 127/84 | HR 93 | Temp 97.70000°F | Ht 64.0 in | Wt 152.2 lb

## 2024-08-12 DIAGNOSIS — H7123 Cholesteatoma of mastoid, bilateral: Principal | ICD-10-CM

## 2024-08-12 DIAGNOSIS — H6123 Impacted cerumen, bilateral: Secondary | ICD-10-CM

## 2024-08-12 NOTE — Progress Notes [1]
 Date of Service: 08/12/2024    Subjective:             [name] is a 53 y.o. female.    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  She status post tympanoplasty with mastoidectomy on both sides.  She is doing okay.             Objective:         acetaminophen  (TYLENOL  8 HOUR PO) Take  by mouth as Needed.    calcium carbonate (TUMS PO) Take  by mouth.    fenofibrate nanocrystallized (TRICOR) 145 mg tablet     fluticasone propionate (FLONASE) 50 mcg/actuation nasal spray, suspension Apply two sprays to each nostril as directed daily as needed. Shake bottle gently before using.    glimepiride (AMARYL) 4 mg tablet     loratadine (CLARITIN) 10 mg tablet     metFORMIN (GLUCOPHAGE) 1,000 mg tablet     Miscellaneous [facility] [employer] misc Please apply mineral oil to Lakaya's ears on Monday, Wednesday, Friday evenings; 5 drops per ear.    montelukast (SINGULAIR) 10 mg tablet     multivitamin (DAILY VITAMINS PO) Take  by mouth.    PARoxetine HCL (PAXIL) 30 mg tablet      Vitals:    08/12/24 1349   BP: 127/84   BP Source: Arm, Left Upper   Pulse: 93   Temp: 36.5 ?C (97.7 ?F)   TempSrc: Temporal   Weight: 69 kg (152 lb 3.2 oz)   Height: 162.6 cm (5' 4)     Body mass index is 26.13 kg/m?Kimberly Sullivan     Physical Exam      Under microscopy I removed impacted cerumen from both sides with curette and suction.  Cartilage grafted posteriorly with some attic retraction without any debris.     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  She status post tympanoplasty with mastoidectomy on both sides.  She is doing okay.    Under microscopy I removed impacted cerumen from both sides with curette and suction.  Cartilage grafted posteriorly with some attic retraction without any debris.    She will continue the mineral oil, I will have her return in 4 to 5 months.
